# Patient Record
Sex: Female | Born: 1995 | Race: Asian | Hispanic: No | Marital: Married | Smoking: Never smoker
Health system: Southern US, Community
[De-identification: ages and names within clinical notes are randomized; demographics above are authoritative.]

## PROBLEM LIST (undated history)

## (undated) ENCOUNTER — Inpatient Hospital Stay (HOSPITAL_COMMUNITY): Payer: Self-pay

## (undated) DIAGNOSIS — Z789 Other specified health status: Secondary | ICD-10-CM

## (undated) HISTORY — PX: NO PAST SURGERIES: SHX2092

---

## 2017-09-19 ENCOUNTER — Ambulatory Visit: Payer: Self-pay | Admitting: Urgent Care

## 2017-09-19 ENCOUNTER — Encounter: Payer: Self-pay | Admitting: Urgent Care

## 2017-09-19 VITALS — BP 99/65 | HR 83 | Temp 98.3°F | Resp 16 | Ht 64.25 in | Wt 108.2 lb

## 2017-09-19 DIAGNOSIS — N926 Irregular menstruation, unspecified: Secondary | ICD-10-CM

## 2017-09-19 DIAGNOSIS — Z3491 Encounter for supervision of normal pregnancy, unspecified, first trimester: Secondary | ICD-10-CM

## 2017-09-19 LAB — POCT URINE PREGNANCY: Preg Test, Ur: POSITIVE — AB

## 2017-09-19 NOTE — Patient Instructions (Addendum)
First Trimester of Pregnancy The first trimester of pregnancy is from week 1 until the end of week 13 (months 1 through 3). A week after a sperm fertilizes an egg, the egg will implant on the wall of the uterus. This embryo will begin to develop into a baby. Genes from you and your partner will form the baby. The female genes will determine whether the baby will be a boy or a girl. At 6-8 weeks, the eyes and face will be formed, and the heartbeat can be seen on ultrasound. At the end of 12 weeks, all the baby's organs will be formed. Now that you are pregnant, you will want to do everything you can to have a healthy baby. Two of the most important things are to get good prenatal care and to follow your health care provider's instructions. Prenatal care is all the medical care you receive before the baby's birth. This care will help prevent, find, and treat any problems during the pregnancy and childbirth. Body changes during your first trimester Your body goes through many changes during pregnancy. The changes vary from woman to woman.  You may gain or lose a couple of pounds at first.  You may feel sick to your stomach (nauseous) and you may throw up (vomit). If the vomiting is uncontrollable, call your health care provider.  You may tire easily.  You may develop headaches that can be relieved by medicines. All medicines should be approved by your health care provider.  You may urinate more often. Painful urination may mean you have a bladder infection.  You may develop heartburn as a result of your pregnancy.  You may develop constipation because certain hormones are causing the muscles that push stool through your intestines to slow down.  You may develop hemorrhoids or swollen veins (varicose veins).  Your breasts may begin to grow larger and become tender. Your nipples may stick out more, and the tissue that surrounds them (areola) may become darker.  Your gums may bleed and may be  sensitive to brushing and flossing.  Dark spots or blotches (chloasma, mask of pregnancy) may develop on your face. This will likely fade after the baby is born.  Your menstrual periods will stop.  You may have a loss of appetite.  You may develop cravings for certain kinds of food.  You may have changes in your emotions from day to day, such as being excited to be pregnant or being concerned that something may go wrong with the pregnancy and baby.  You may have more vivid and strange dreams.  You may have changes in your hair. These can include thickening of your hair, rapid growth, and changes in texture. Some women also have hair loss during or after pregnancy, or hair that feels dry or thin. Your hair will most likely return to normal after your baby is born.  What to expect at prenatal visits During a routine prenatal visit:  You will be weighed to make sure you and the baby are growing normally.  Your blood pressure will be taken.  Your abdomen will be measured to track your baby's growth.  The fetal heartbeat will be listened to between weeks 10 and 14 of your pregnancy.  Test results from any previous visits will be discussed.  Your health care provider may ask you:  How you are feeling.  If you are feeling the baby move.  If you have had any abnormal symptoms, such as leaking fluid, bleeding, severe headaches,   or abdominal cramping.  If you are using any tobacco products, including cigarettes, chewing tobacco, and electronic cigarettes.  If you have any questions.  Other tests that may be performed during your first trimester include:  Blood tests to find your blood type and to check for the presence of any previous infections. The tests will also be used to check for low iron levels (anemia) and protein on red blood cells (Rh antibodies). Depending on your risk factors, or if you previously had diabetes during pregnancy, you may have tests to check for high blood  sugar that affects pregnant women (gestational diabetes).  Urine tests to check for infections, diabetes, or protein in the urine.  An ultrasound to confirm the proper growth and development of the baby.  Fetal screens for spinal cord problems (spina bifida) and Down syndrome.  HIV (human immunodeficiency virus) testing. Routine prenatal testing includes screening for HIV, unless you choose not to have this test.  You may need other tests to make sure you and the baby are doing well.  Follow these instructions at home: Medicines  Follow your health care provider's instructions regarding medicine use. Specific medicines may be either safe or unsafe to take during pregnancy.  Take a prenatal vitamin that contains at least 600 micrograms (mcg) of folic acid.  If you develop constipation, try taking a stool softener if your health care provider approves. Eating and drinking  Eat a balanced diet that includes fresh fruits and vegetables, whole grains, good sources of protein such as meat, eggs, or tofu, and low-fat dairy. Your health care provider will help you determine the amount of weight gain that is right for you.  Avoid raw meat and uncooked cheese. These carry germs that can cause birth defects in the baby.  Eating four or five small meals rather than three large meals a day may help relieve nausea and vomiting. If you start to feel nauseous, eating a few soda crackers can be helpful. Drinking liquids between meals, instead of during meals, also seems to help ease nausea and vomiting.  Limit foods that are high in fat and processed sugars, such as fried and sweet foods.  To prevent constipation: ? Eat foods that are high in fiber, such as fresh fruits and vegetables, whole grains, and beans. ? Drink enough fluid to keep your urine clear or pale yellow. Activity  Exercise only as directed by your health care provider. Most women can continue their usual exercise routine during  pregnancy. Try to exercise for 30 minutes at least 5 days a week. Exercising will help you: ? Control your weight. ? Stay in shape. ? Be prepared for labor and delivery.  Experiencing pain or cramping in the lower abdomen or lower back is a good sign that you should stop exercising. Check with your health care provider before continuing with normal exercises.  Try to avoid standing for long periods of time. Move your legs often if you must stand in one place for a long time.  Avoid heavy lifting.  Wear low-heeled shoes and practice good posture.  You may continue to have sex unless your health care provider tells you not to. Relieving pain and discomfort  Wear a good support bra to relieve breast tenderness.  Take warm sitz baths to soothe any pain or discomfort caused by hemorrhoids. Use hemorrhoid cream if your health care provider approves.  Rest with your legs elevated if you have leg cramps or low back pain.  If you develop   varicose veins in your legs, wear support hose. Elevate your feet for 15 minutes, 3-4 times a day. Limit salt in your diet. Prenatal care  Schedule your prenatal visits by the twelfth week of pregnancy. They are usually scheduled monthly at first, then more often in the last 2 months before delivery.  Write down your questions. Take them to your prenatal visits.  Keep all your prenatal visits as told by your health care provider. This is important. Safety  Wear your seat belt at all times when driving.  Make a list of emergency phone numbers, including numbers for family, friends, the hospital, and police and fire departments. General instructions  Ask your health care provider for a referral to a local prenatal education class. Begin classes no later than the beginning of month 6 of your pregnancy.  Ask for help if you have counseling or nutritional needs during pregnancy. Your health care provider can offer advice or refer you to specialists for help  with various needs.  Do not use hot tubs, steam rooms, or saunas.  Do not douche or use tampons or scented sanitary pads.  Do not cross your legs for long periods of time.  Avoid cat litter boxes and soil used by cats. These carry germs that can cause birth defects in the baby and possibly loss of the fetus by miscarriage or stillbirth.  Avoid all smoking, herbs, alcohol, and medicines not prescribed by your health care provider. Chemicals in these products affect the formation and growth of the baby.  Do not use any products that contain nicotine or tobacco, such as cigarettes and e-cigarettes. If you need help quitting, ask your health care provider. You may receive counseling support and other resources to help you quit.  Schedule a dentist appointment. At home, brush your teeth with a soft toothbrush and be gentle when you floss. Contact a health care provider if:  You have dizziness.  You have mild pelvic cramps, pelvic pressure, or nagging pain in the abdominal area.  You have persistent nausea, vomiting, or diarrhea.  You have a bad smelling vaginal discharge.  You have pain when you urinate.  You notice increased swelling in your face, hands, legs, or ankles.  You are exposed to fifth disease or chickenpox.  You are exposed to German measles (rubella) and have never had it. Get help right away if:  You have a fever.  You are leaking fluid from your vagina.  You have spotting or bleeding from your vagina.  You have severe abdominal cramping or pain.  You have rapid weight gain or loss.  You vomit blood or material that looks like coffee grounds.  You develop a severe headache.  You have shortness of breath.  You have any kind of trauma, such as from a fall or a car accident. Summary  The first trimester of pregnancy is from week 1 until the end of week 13 (months 1 through 3).  Your body goes through many changes during pregnancy. The changes vary from  woman to woman.  You will have routine prenatal visits. During those visits, your health care provider will examine you, discuss any test results you may have, and talk with you about how you are feeling. This information is not intended to replace advice given to you by your health care provider. Make sure you discuss any questions you have with your health care provider. Document Released: 03/01/2001 Document Revised: 02/17/2016 Document Reviewed: 02/17/2016 Elsevier Interactive Patient Education  2018 Elsevier   Inc.     IF you received an x-ray today, you will receive an invoice from [facility]. Please contact [facility] at [phone] with questions or concerns regarding your invoice.   IF you received labwork today, you will receive an invoice from [facility]. Please contact [facility] at [phone] with questions or concerns regarding your invoice.   Our billing staff will not be able to assist you with questions regarding bills from these companies.  You will be contacted with the lab results as soon as they are available. The fastest way to get your results is to activate your My Chart account. Instructions are located on the last page of this paperwork. If you have not heard from us regarding the results in 2 weeks, please contact this office.

## 2017-09-19 NOTE — Progress Notes (Signed)
    MRN: [id number] DOB: 1995-05-24  Subjective:   Valerie Ward is a 22 y.o. female presenting for confirmation of pregnancy so that she can obtain obstetrician care.  Patient is from [place].  She had a positive pregnancy test on 08/20/2017.  Her ultrasound showed that she was about [redacted] weeks pregnant at the time.  She is currently taking a prenatal vitamin.  She also had immunizations updated on 07/18/2017 including MMR, varicella.  Valerie Ward currently has no medications in their medication list. Also has No Known Allergies.  Valerie Ward  has no past medical history on file. Also  has no past surgical history on file.  Objective:   Vitals: BP 99/65   Pulse 83   Temp 98.3 F (36.8 C) (Oral)   Resp 16   Ht 5' 4.25" (1.632 m)   Wt 108 lb 3.2 oz (49.1 kg)   LMP 08/31/2017   SpO2 99%   BMI 18.43 kg/m   Physical Exam  Constitutional: She is oriented to person, place, and time. She appears well-developed and well-nourished.  Cardiovascular: Normal rate.  Pulmonary/Chest: Effort normal.  Neurological: She is alert and oriented to person, place, and time.   Results for orders placed or performed in visit on 09/19/17 (from the past 24 hour(s))  POCT urine pregnancy     Status: Abnormal   Collection Time: 09/19/17  2:36 PM  Result Value Ref Range   Preg Test, Ur Positive (A) Negative   Assessment and Plan :   First trimester pregnancy  Missed period - Plan: POCT urine pregnancy  Letter provided to patient as her urine pregnancy test was positive in our clinic today.  Follow-up as needed.  Jaynee Eagles, PA-C [facility] [phone] 09/19/2017  2:57 PM

## 2017-09-20 ENCOUNTER — Telehealth: Payer: Self-pay | Admitting: Urgent Care

## 2017-09-20 NOTE — Telephone Encounter (Signed)
Copied from CRM [phone]. Topic: General - Other >> Sep 20, 2017 10:18 AM Oneal GroutSebastian, Jennifer S wrote: Reason for CRM: Would like know pregnancy due date

## 2017-09-21 ENCOUNTER — Encounter (HOSPITAL_COMMUNITY): Payer: Self-pay | Admitting: *Deleted

## 2017-09-21 ENCOUNTER — Inpatient Hospital Stay (HOSPITAL_COMMUNITY)
Admission: AD | Admit: 2017-09-21 | Discharge: 2017-09-21 | Disposition: A | Discharge status: Home / Self Care | Payer: Medicaid Other | Source: Ambulatory Visit | Attending: Obstetrics and Gynecology | Admitting: Obstetrics and Gynecology

## 2017-09-21 ENCOUNTER — Inpatient Hospital Stay (HOSPITAL_COMMUNITY): Payer: Medicaid Other

## 2017-09-21 DIAGNOSIS — Z3A12 12 weeks gestation of pregnancy: Secondary | ICD-10-CM | POA: Diagnosis not present

## 2017-09-21 DIAGNOSIS — O209 Hemorrhage in early pregnancy, unspecified: Secondary | ICD-10-CM | POA: Diagnosis present

## 2017-09-21 HISTORY — DX: Other specified health status: Z78.9

## 2017-09-21 LAB — CBC WITH DIFFERENTIAL/PLATELET
Basophils Absolute: 0 10*3/uL (ref 0.0–0.1)
Basophils Relative: 0 %
EOS ABS: 0.1 10*3/uL (ref 0.0–0.7)
Eosinophils Relative: 1 %
HCT: 36.1 % (ref 36.0–46.0)
HEMOGLOBIN: 12.1 g/dL (ref 12.0–15.0)
LYMPHS PCT: 23 %
Lymphs Abs: 2.6 10*3/uL (ref 0.7–4.0)
MCH: 31.2 pg (ref 26.0–34.0)
MCHC: 33.5 g/dL (ref 30.0–36.0)
MCV: 93 fL (ref 78.0–100.0)
MONOS PCT: 3 %
Monocytes Absolute: 0.3 10*3/uL (ref 0.1–1.0)
NEUTROS ABS: 8.1 10*3/uL — AB (ref 1.7–7.7)
Neutrophils Relative %: 73 %
Other: 0 %
Platelets: 138 10*3/uL — ABNORMAL LOW (ref 150–400)
RBC: 3.88 MIL/uL (ref 3.87–5.11)
RDW: 14.1 % (ref 11.5–15.5)
WBC: 11.1 10*3/uL — AB (ref 4.0–10.5)

## 2017-09-21 LAB — HCG, QUANTITATIVE, PREGNANCY: HCG, BETA CHAIN, QUANT, S: 196919 m[IU]/mL — AB (ref <5)

## 2017-09-21 NOTE — Discharge Instructions (Signed)
Vaginal Bleeding During Pregnancy, First Trimester A small amount of bleeding (spotting) from the vagina is common in early pregnancy. Sometimes the bleeding is normal and is not a problem, and sometimes it is a sign of something serious. Be sure to tell your doctor about any bleeding from your vagina right away. Follow these instructions at home:  Watch your condition for any changes.  Follow your doctor's instructions about how active you can be.  If you are on bed rest: ? You may need to stay in bed and only get up to use the bathroom. ? You may be allowed to do some activities. ? If you need help, make plans for someone to help you.  Write down: ? The number of pads you use each day. ? How often you change pads. ? How soaked (saturated) your pads are.  Do not use tampons.  Do not douche.  Do not have sex or orgasms until your doctor says it is okay.  If you pass any tissue from your vagina, save the tissue so you can show it to your doctor.  Only take medicines as told by your doctor.  Do not take aspirin because it can make you bleed.  Keep all follow-up visits as told by your doctor. Contact a doctor if:  You bleed from your vagina.  You have cramps.  You have labor pains.  You have a fever that does not go away after you take medicine. Get help right away if:  You have very bad cramps in your back or belly (abdomen).  You pass large clots or tissue from your vagina.  You bleed more.  You feel light-headed or weak.  You pass out (faint).  You have chills.  You are leaking fluid or have a gush of fluid from your vagina.  You pass out while pooping (having a bowel movement). This information is not intended to replace advice given to you by your health care provider. Make sure you discuss any questions you have with your health care provider. Document Released: 07/22/2013 Document Revised: 08/13/2015 Document Reviewed: 11/12/2012 Elsevier Interactive  Patient Education  2018 [employer].   Pelvic Rest Pelvic rest may be recommended if:  Your placenta is partially or completely covering the opening of your cervix (placenta previa).  There is bleeding between the wall of the uterus and the amniotic sac in the first trimester of pregnancy (subchorionic hemorrhage).  You went into labor too early (preterm labor).  Based on your overall health and the health of your baby, your health care provider will decide if pelvic rest is right for you. How do I rest my pelvis? For as long as told by your health care provider:  Do not have sex, sexual stimulation, or an orgasm.  Do not use tampons. Do not douche. Do not put anything in your vagina.  Do not lift anything that is heavier than 10 lb (4.5 kg).  Avoid activities that take a lot of effort (are strenuous).  Avoid any activity in which your pelvic muscles could become strained.  When should I seek medical care? Seek medical care if you have:  Cramping pain in your lower abdomen.  Vaginal discharge.  A low, dull backache.  Regular contractions.  Uterine tightening.  When should I seek immediate medical care? Seek immediate medical care if:  You have vaginal bleeding and you are pregnant.  This information is not intended to replace advice given to you by your health care provider. Make  sure you discuss any questions you have with your health care provider. Document Released: 07/02/2010 Document Revised: 08/13/2015 Document Reviewed: 09/08/2014 Elsevier Interactive Patient Education  2018 [employer].  First Trimester of Pregnancy The first trimester of pregnancy is from week 1 until the end of week 13 (months 1 through 3). During this time, your baby will begin to develop inside you. At 6-8 weeks, the eyes and face are formed, and the heartbeat can be seen on ultrasound. At the end of 12 weeks, all the baby's organs are formed. Prenatal care is all the medical care you  receive before the birth of your baby. Make sure you get good prenatal care and follow all of your doctor's instructions. Follow these instructions at home: Medicines  Take over-the-counter and prescription medicines only as told by your doctor. Some medicines are safe and some medicines are not safe during pregnancy.  Take a prenatal vitamin that contains at least 600 micrograms (mcg) of folic acid.  If you have trouble pooping (constipation), take medicine that will make your stool soft (stool softener) if your doctor approves. Eating and drinking  Eat regular, healthy meals.  Your doctor will tell you the amount of weight gain that is right for you.  Avoid raw meat and uncooked cheese.  If you feel sick to your stomach (nauseous) or throw up (vomit): ? Eat 4 or 5 small meals a day instead of 3 large meals. ? Try eating a few soda crackers. ? Drink liquids between meals instead of during meals.  To prevent constipation: ? Eat foods that are high in fiber, like fresh fruits and vegetables, whole grains, and beans. ? Drink enough fluids to keep your pee (urine) clear or pale yellow. Activity  Exercise only as told by your doctor. Stop exercising if you have cramps or pain in your lower belly (abdomen) or low back.  Do not exercise if it is too hot, too humid, or if you are in a place of great height (high altitude).  Try to avoid standing for long periods of time. Move your legs often if you must stand in one place for a long time.  Avoid heavy lifting.  Wear low-heeled shoes. Sit and stand up straight.  You can have sex unless your doctor tells you not to. Relieving pain and discomfort  Wear a good support bra if your breasts are sore.  Take warm water baths (sitz baths) to soothe pain or discomfort caused by hemorrhoids. Use hemorrhoid cream if your doctor says it is okay.  Rest with your legs raised if you have leg cramps or low back pain.  If you have puffy, bulging  veins (varicose veins) in your legs: ? Wear support hose or compression stockings as told by your doctor. ? Raise (elevate) your feet for 15 minutes, 3-4 times a day. ? Limit salt in your food. Prenatal care  Schedule your prenatal visits by the twelfth week of pregnancy.  Write down your questions. Take them to your prenatal visits.  Keep all your prenatal visits as told by your doctor. This is important. Safety  Wear your seat belt at all times when driving.  Make a list of emergency phone numbers. The list should include numbers for family, friends, the hospital, and police and fire departments. General instructions  Ask your doctor for a referral to a local prenatal class. Begin classes no later than at the start of month 6 of your pregnancy.  Ask for help if you need  counseling or if you need help with nutrition. Your doctor can give you advice or tell you where to go for help.  Do not use hot tubs, steam rooms, or saunas.  Do not douche or use tampons or scented sanitary pads.  Do not cross your legs for long periods of time.  Avoid all herbs and alcohol. Avoid drugs that are not approved by your doctor.  Do not use any tobacco products, including cigarettes, chewing tobacco, and electronic cigarettes. If you need help quitting, ask your doctor. You may get counseling or other support to help you quit.  Avoid cat litter boxes and soil used by cats. These carry germs that can cause birth defects in the baby and can cause a loss of your baby (miscarriage) or stillbirth.  Visit your dentist. At home, brush your teeth with a soft toothbrush. Be gentle when you floss. Contact a doctor if:  You are dizzy.  You have mild cramps or pressure in your lower belly.  You have a nagging pain in your belly area.  You continue to feel sick to your stomach, you throw up, or you have watery poop (diarrhea).  You have a bad smelling fluid coming from your vagina.  You have pain when  you pee (urinate).  You have increased puffiness (swelling) in your face, hands, legs, or ankles. Get help right away if:  You have a fever.  You are leaking fluid from your vagina.  You have spotting or bleeding from your vagina.  You have very bad belly cramping or pain.  You gain or lose weight rapidly.  You throw up blood. It may look like coffee grounds.  You are around people who have [place] measles, fifth disease, or chickenpox.  You have a very bad headache.  You have shortness of breath.  You have any kind of trauma, such as from a fall or a car accident. Summary  The first trimester of pregnancy is from week 1 until the end of week 13 (months 1 through 3).  To take care of yourself and your unborn baby, you will need to eat healthy meals, take medicines only if your doctor tells you to do so, and do activities that are safe for you and your baby.  Keep all follow-up visits as told by your doctor. This is important as your doctor will have to ensure that your baby is healthy and growing well. This information is not intended to replace advice given to you by your health care provider. Make sure you discuss any questions you have with your health care provider. Document Released: 08/24/2007 Document Revised: 03/15/2016 Document Reviewed: 03/15/2016 Elsevier Interactive Patient Education  2017 [employer]

## 2017-09-21 NOTE — MAU Note (Signed)
Pt reports she is [redacted] weeks pregnant and is bleeding

## 2017-09-21 NOTE — MAU Provider Note (Signed)
History     CSN: [id number]  Arrival date and time: 09/21/17 7829   First Provider Initiated Contact with Patient 09/21/17 1918      Chief Complaint  Patient presents with  . Vaginal Bleeding   HPI  Ms. Valerie Ward is a 22 y.o. G1P0 at [redacted]w[redacted]d who presents to MAU today with complaint of vaginal bleeding x 1 hour. The patient states bleeding is heavy. She has had mild cramping since this morning. She has not taken anything for pain. She denies fever. She had +UPT at urgent care earlier this week. She states LMP 06/26/17.    OB History    Gravida  1   Para      Term      Preterm      AB      Living        SAB      TAB      Ectopic      Multiple      Live Births              Past Medical History:  Diagnosis Date  . Medical history non-contributory     Past Surgical History:  Procedure Laterality Date  . NO PAST SURGERIES      Family History  Problem Relation Age of Onset  . ADD / ADHD Neg Hx   . Alcohol abuse Neg Hx   . Arthritis Neg Hx   . Anxiety disorder Neg Hx   . Asthma Neg Hx   . Birth defects Neg Hx   . Cancer Neg Hx   . COPD Neg Hx   . Depression Neg Hx   . Diabetes Neg Hx   . Drug abuse Neg Hx   . Early death Neg Hx   . Hearing loss Neg Hx   . Heart disease Neg Hx   . Hyperlipidemia Neg Hx   . Hypertension Neg Hx   . Intellectual disability Neg Hx   . Learning disabilities Neg Hx   . Kidney disease Neg Hx   . Miscarriages / Stillbirths Neg Hx   . Obesity Neg Hx   . Stroke Neg Hx   . Vision loss Neg Hx   . Varicose Veins Neg Hx     Social History   Tobacco Use  . Smoking status: Never Smoker  . Smokeless tobacco: Never Used  Substance Use Topics  . Alcohol use: Never    Frequency: Never  . Drug use: Never    Allergies: No Known Allergies  Medications Prior to Admission  Medication Sig Dispense Refill Last Dose  . Prenatal Vit-Fe Fumarate-FA (PRENATAL MULTIVITAMIN) TABS tablet Take 1 tablet by mouth daily at 12 noon.    09/20/2017 at Unknown time    Review of Systems  Constitutional: Negative for fever.  Gastrointestinal: Positive for abdominal pain. Negative for constipation, diarrhea, nausea and vomiting.  Genitourinary: Positive for vaginal bleeding. Negative for vaginal discharge.   Physical Exam   Blood pressure 100/61, pulse 70, temperature 98.5 F (36.9 C), temperature source Oral, resp. rate 17, height 5' 3.5" (1.613 m), weight 109 lb (49.4 kg), last menstrual period 06/26/2017, SpO2 100 %.  Physical Exam  Nursing note and vitals reviewed. Constitutional: She is oriented to person, place, and time. She appears well-developed and well-nourished. No distress.  HENT:  Head: Normocephalic and atraumatic.  Cardiovascular: Normal rate.  Respiratory: Effort normal.  GI: Soft. She exhibits no distension and no mass. There is no tenderness. There is  no rebound and no guarding.  Genitourinary: Uterus is enlarged. Uterus is not tender. Cervix exhibits no motion tenderness, no discharge and no friability. There is bleeding (small) in the vagina. No vaginal discharge found.  Genitourinary Comments: Cervix: closed, thick, firm  Neurological: She is alert and oriented to person, place, and time.  Skin: Skin is warm and dry. No erythema.  Psychiatric: She has a normal mood and affect.     Results for orders placed or performed during the hospital encounter of 09/21/17 (from the past 24 hour(s))  CBC with Differential/Platelet     Status: Abnormal   Collection Time: 09/21/17  7:21 PM  Result Value Ref Range   WBC 11.1 (H) 4.0 - 10.5 K/uL   RBC 3.88 3.87 - 5.11 MIL/uL   Hemoglobin 12.1 12.0 - 15.0 g/dL   HCT 82.9 56.2 - 13.0 %   MCV 93.0 78.0 - 100.0 fL   MCH 31.2 26.0 - 34.0 pg   MCHC 33.5 30.0 - 36.0 g/dL   RDW 86.5 78.4 - 69.6 %   Platelets 138 (L) 150 - 400 K/uL   Neutrophils Relative % 73 %   Lymphocytes Relative 23 %   Monocytes Relative 3 %   Eosinophils Relative 1 %   Basophils Relative 0 %    Other 0 %   Neutro Abs 8.1 (H) 1.7 - 7.7 K/uL   Lymphs Abs 2.6 0.7 - 4.0 K/uL   Monocytes Absolute 0.3 0.1 - 1.0 K/uL   Eosinophils Absolute 0.1 0.0 - 0.7 K/uL   Basophils Absolute 0.0 0.0 - 0.1 K/uL   WBC Morphology ATYPICAL LYMPHOCYTES   ABO/Rh     Status: None (Preliminary result)   Collection Time: 09/21/17  7:21 PM  Result Value Ref Range   ABO/RH(D)      O POS Performed at [facility], [address] [place], [place] [address]    US Ob Comp Less 14 Wks  Result Date: 09/21/2017 CLINICAL DATA:  22 year old pregnant female with vaginal bleeding today. Estimated gestational age of [redacted] weeks 3 days by LMP. EXAM: OBSTETRIC <14 WK ULTRASOUND TECHNIQUE: Transabdominal ultrasound was performed for evaluation of the gestation as well as the maternal uterus and adnexal regions. COMPARISON:  None. FINDINGS: Intrauterine gestational sac: Single Yolk sac:  Not Visualized. Embryo:  Visualized. Cardiac Activity: Visualized. Heart Rate: 159 bpm CRL:   58 mm   12 w 2 d                  [name] EDC: 04/03/2010 Subchorionic hemorrhage:  None visualized. Maternal uterus/adnexae: The ovaries bilaterally are unremarkable. No free fluid or adnexal mass. IMPRESSION: Single living intrauterine gestation with estimated gestational age of [redacted] weeks 3 days by LMP. Estimated gestational age by this ultrasound is 12 weeks 2 days. No subchorionic hemorrhage or acute abnormality. Electronically Signed   By: Harmon Pier M.D.   On: 09/21/2017 19:52     MAU Course  Procedures None  MDM +UPT at Urgent Care this week in Epic  UA, CBC, ABO/Rh, quant hCG and [name] today to rule out ectopic pregnancy  Assessment and Plan  A: SIUP at [redacted]w[redacted]d Vaginal bleeding in pregnancy, first trimester   P: Discharge home Tylenol PRN for pain  Bleeding precautions discussed Patient advised to follow-up with [facility] as planned to start prenatal care at the end of the month Patient may return to MAU as needed or if her condition were  to change or worsen   Vonzella Nipple,  PA-C 09/21/2017, 8:23 PM

## 2017-09-21 NOTE — MAU Note (Signed)
Urine in lab 

## 2017-09-22 LAB — ABO/RH: ABO/RH(D): O POS

## 2017-09-23 NOTE — Telephone Encounter (Signed)
LMOVM for pt to count from date of U/S forward 33 weeks.  She was 7 weeks at U/S and total pregnancy is 40 weeks. Asked pt to call the office with any further questions.

## 2017-10-23 LAB — OB RESULTS CONSOLE HEPATITIS B SURFACE ANTIGEN: Hepatitis B Surface Ag: NEGATIVE

## 2017-10-23 LAB — OB RESULTS CONSOLE GC/CHLAMYDIA
Chlamydia: NEGATIVE
Gonorrhea: NEGATIVE

## 2017-10-23 LAB — OB RESULTS CONSOLE HIV ANTIBODY (ROUTINE TESTING): HIV: NONREACTIVE

## 2017-10-23 LAB — OB RESULTS CONSOLE RUBELLA ANTIBODY, IGM: Rubella: IMMUNE

## 2018-03-06 LAB — OB RESULTS CONSOLE GBS: GBS: NEGATIVE

## 2018-03-21 NOTE — L&D Delivery Note (Signed)
Delivery Note At 1:13 AM a viable female was delivered via Vaginal, Spontaneous (Presentation OP). Loose nuchal cord reduced. APGAR: 8, 9; weight 8 lb 3.6 oz (3731 g).   Placenta status: Spontaneous and intact, .  Cord: 3VC with the following complications: NONE  Anesthesia:  Epidural Episiotomy: None Lacerations: 1st degree Suture Repair: 2.0 3.0 vicryl Est. Blood Loss (mL): 925  Mom to postpartum.  Baby to Couplet care / Skin to Skin.  Valerie Ward 04/02/2018, 3:05 AM  Late entry

## 2018-03-27 ENCOUNTER — Inpatient Hospital Stay (HOSPITAL_COMMUNITY)
Admission: AD | Admit: 2018-03-27 | Discharge: 2018-03-27 | Disposition: A | Discharge status: Home / Self Care | Payer: Medicaid Other | Attending: Obstetrics and Gynecology | Admitting: Obstetrics and Gynecology

## 2018-03-27 ENCOUNTER — Other Ambulatory Visit: Payer: Self-pay

## 2018-03-27 DIAGNOSIS — O471 False labor at or after 37 completed weeks of gestation: Secondary | ICD-10-CM | POA: Diagnosis present

## 2018-03-27 DIAGNOSIS — Z3689 Encounter for other specified antenatal screening: Secondary | ICD-10-CM

## 2018-03-27 DIAGNOSIS — Z3A39 39 weeks gestation of pregnancy: Secondary | ICD-10-CM | POA: Diagnosis not present

## 2018-03-27 DIAGNOSIS — O479 False labor, unspecified: Secondary | ICD-10-CM

## 2018-03-27 LAB — URINALYSIS, ROUTINE W REFLEX MICROSCOPIC
Bilirubin Urine: NEGATIVE
Glucose, UA: NEGATIVE mg/dL
Hgb urine dipstick: NEGATIVE
Ketones, ur: NEGATIVE mg/dL
Nitrite: NEGATIVE
Protein, ur: NEGATIVE mg/dL
Specific Gravity, Urine: 1.003 — ABNORMAL LOW (ref 1.005–1.030)
pH: 6 (ref 5.0–8.0)

## 2018-03-27 NOTE — MAU Provider Note (Signed)
None    S: Ms. Letoria Terranova is a 23 y.o. G1P0 at [redacted]w[redacted]d  who presents to MAU today complaining contractions q 2-10 minutes since yesterday afternoon. She denies vaginal bleeding. She denies LOF. She reports normal fetal movement but states she had difficulty identifying fetal movement last night.    O: BP 114/67   Pulse 93   Temp (!) 97.5 F (36.4 C)   Resp 16   Ht 5\' 4"  (1.626 m)   Wt 78 kg   LMP 06/26/2017   SpO2 100%   BMI 29.52 kg/m  GENERAL: Well-developed, well-nourished female in no acute distress.  HEAD: Normocephalic, atraumatic.  CHEST: Normal effort of breathing, regular heart rate ABDOMEN: Soft, nontender, gravid  Cervical exam:  Dilation: 1.5 Effacement (%): Thick Cervical Position: Posterior Station: -3 Presentation: Vertex Exam by:: Ginnie Smart RN   Fetal Monitoring: Baseline: 145 Variability: moderate Accelerations: positive Decelerations: none Contractions: irregular, palpate mild, not consistently felt by patient   A: SIUP at [redacted]w[redacted]d  False labor Reactive fetal tracing Discomfort exacerbated by fetal positioning  P: Discharge home in stable condition Keep f/u appt scheduled for 04/02/2018  Calvert Cantor, CNM 03/27/2018 12:50 PM

## 2018-03-27 NOTE — MAU Note (Signed)
Pt presents to MAU with complaints of lower abdominal cramping since last night. Decrease in fetal movement. Denies any VB or LOF

## 2018-03-27 NOTE — Discharge Instructions (Signed)
Braxton Hicks Contractions Contractions of the uterus can occur throughout pregnancy, but they are not always a sign that you are in labor. You may have practice contractions called Braxton Hicks contractions. These false labor contractions are sometimes confused with true labor. What are Braxton Hicks contractions? Braxton Hicks contractions are tightening movements that occur in the muscles of the uterus before labor. Unlike true labor contractions, these contractions do not result in opening (dilation) and thinning of the cervix. Toward the end of pregnancy (32-34 weeks), Braxton Hicks contractions can happen more often and may become stronger. These contractions are sometimes difficult to tell apart from true labor because they can be very uncomfortable. You should not feel embarrassed if you go to the hospital with false labor. Sometimes, the only way to tell if you are in true labor is for your health care provider to look for changes in the cervix. The health care provider will do a physical exam and may monitor your contractions. If you are not in true labor, the exam should show that your cervix is not dilating and your water has not broken. If there are no other health problems associated with your pregnancy, it is completely safe for you to be sent home with false labor. You may continue to have Braxton Hicks contractions until you go into true labor. How to tell the difference between true labor and false labor True labor  Contractions last 30-70 seconds.  Contractions become very regular.  Discomfort is usually felt in the top of the uterus, and it spreads to the lower abdomen and low back.  Contractions do not go away with walking.  Contractions usually become more intense and increase in frequency.  The cervix dilates and gets thinner. False labor  Contractions are usually shorter and not as strong as true labor contractions.  Contractions are usually irregular.  Contractions  are often felt in the front of the lower abdomen and in the groin.  Contractions may go away when you walk around or change positions while lying down.  Contractions get weaker and are shorter-lasting as time goes on.  The cervix usually does not dilate or become thin. Follow these instructions at home:   Take over-the-counter and prescription medicines only as told by your health care provider.  Keep up with your usual exercises and follow other instructions from your health care provider.  Eat and drink lightly if you think you are going into labor.  If Braxton Hicks contractions are making you uncomfortable: ? Change your position from lying down or resting to walking, or change from walking to resting. ? Sit and rest in a tub of warm water. ? Drink enough fluid to keep your urine pale yellow. Dehydration may cause these contractions. ? Do slow and deep breathing several times an hour.  Keep all follow-up prenatal visits as told by your health care provider. This is important. Contact a health care provider if:  You have a fever.  You have continuous pain in your abdomen. Get help right away if:  Your contractions become stronger, more regular, and closer together.  You have fluid leaking or gushing from your vagina.  You pass blood-tinged mucus (bloody show).  You have bleeding from your vagina.  You have low back pain that you never had before.  You feel your baby's head pushing down and causing pelvic pressure.  Your baby is not moving inside you as much as it used to. Summary  Contractions that occur before labor are   called Braxton Hicks contractions, false labor, or practice contractions.  Braxton Hicks contractions are usually shorter, weaker, farther apart, and less regular than true labor contractions. True labor contractions usually become progressively stronger and regular, and they become more frequent.  Manage discomfort from Braxton Hicks contractions  by changing position, resting in a warm bath, drinking plenty of water, or practicing deep breathing. This information is not intended to replace advice given to you by your health care provider. Make sure you discuss any questions you have with your health care provider. Document Released: 07/21/2016 Document Revised: 12/20/2016 Document Reviewed: 07/21/2016 Elsevier Interactive Patient Education  2019 [employer]  

## 2018-03-29 ENCOUNTER — Inpatient Hospital Stay (HOSPITAL_COMMUNITY)
Admission: AD | Admit: 2018-03-29 | Discharge: 2018-03-29 | Disposition: A | Discharge status: Home / Self Care | Payer: Medicaid Other | Source: Intra-hospital | Attending: Obstetrics & Gynecology | Admitting: Obstetrics & Gynecology

## 2018-03-29 ENCOUNTER — Encounter (HOSPITAL_COMMUNITY): Payer: Self-pay | Admitting: *Deleted

## 2018-03-29 DIAGNOSIS — O4693 Antepartum hemorrhage, unspecified, third trimester: Secondary | ICD-10-CM | POA: Diagnosis present

## 2018-03-29 DIAGNOSIS — O26893 Other specified pregnancy related conditions, third trimester: Secondary | ICD-10-CM | POA: Diagnosis not present

## 2018-03-29 DIAGNOSIS — Z0371 Encounter for suspected problem with amniotic cavity and membrane ruled out: Secondary | ICD-10-CM

## 2018-03-29 DIAGNOSIS — Z3A39 39 weeks gestation of pregnancy: Secondary | ICD-10-CM | POA: Diagnosis not present

## 2018-03-29 DIAGNOSIS — N898 Other specified noninflammatory disorders of vagina: Secondary | ICD-10-CM

## 2018-03-29 LAB — WET PREP, GENITAL
CLUE CELLS WET PREP: NONE SEEN
SPERM: NONE SEEN
TRICH WET PREP: NONE SEEN
YEAST WET PREP: NONE SEEN

## 2018-03-29 NOTE — MAU Note (Signed)
Pt reports vaginal bleeding off/on since last night, some contractions.

## 2018-03-29 NOTE — Discharge Instructions (Signed)
First Stage of Labor °Labor is your body's natural process of moving your baby and other structures, including the placenta and umbilical cord, out of your uterus. There are three stages of labor. How long each stage lasts is different for every woman. But certain events happen during each stage that are the same for everyone. °· The first stage starts when true labor begins. This stage ends when your cervix, which is the opening from your uterus into your vagina, is completely open (dilated). °· The second stage begins when your cervix is fully dilated and you start pushing. This stage ends when your baby is born. °· The third stage is the delivery of the organ that nourished your baby during pregnancy (placenta). °First stage of labor °As your due date gets closer, you may start to notice certain physical changes that mean labor is going to start soon. You may feel that your baby has dropped lower into your pelvis. You may experience irregular, often painless, contractions that go away when you walk around or lie down (Braxton Hicks contractions). This is also called false labor. °The first stage of labor begins when you start having contractions that come at regular (evenly spaced) intervals and your cervix starts to get thinner and wider in preparation for your baby to pass through. Birth care providers measure the dilation of your cervix in centimeters (cm). One centimeter is a little less than one-half of an inch. The first stage ends when your cervix is dilated to 10 cm. The first stage of labor is divided into three phases: °· Early phase. °· Active phase. °· Transitional phase. °The length of the first stage of labor varies. It may be longer if this is your first pregnancy. You may spend most of this stage at home trying to relax and stay comfortable. °How does this affect me? °During the first stage of labor, you will move through three phases. °What happens in the early phase? °· You will start to have  regular contractions that last 30-60 seconds. Contractions may come every 5-20 minutes. Keep track of your contractions and call your birth care provider. °· Your water may break during this phase. °· You may notice a clear or slightly bloody discharge of mucus (mucus plug) from your vagina. °· Your cervix will dilate to 3-6 cm. °What happens in the active phase? °The active phase usually lasts 3-5 hours. You may go to the hospital or birth center around this time. During the active phase: °· Your contractions will become stronger, longer, and more uncomfortable. °· Your contractions may last 45-90 seconds and come every 3-5 minutes. °· You may feel lower back pain. °· Your birth care providers may examine your cervix and feel your belly to find the position of your baby. °· You may have a monitor strapped to your belly to measure your contractions and your baby's heart rate. °· You may start using your pain management options. °· Your cervix may be dilated to 6 cm and may start to dilate more quickly. °What happens in the transitional phase? °The transitional phase typically lasts from 30 minutes to 2 hours. At the end of this phase, your cervix will be fully dilated to 10 cm. During the transitional phase: °· Contractions will get stronger and longer. °· Contractions may last 60-90 seconds and come less than 2 minutes apart. °· You may feel hot flashes, chills, or nausea. °How does this affect my baby? °During the first stage of labor, your baby will   gradually move down into your birth canal. °Follow these instructions at home and in the hospital or birth center: ° °· When labor first begins, try to stay calm. You are still in the early phase. If it is night, try to get some sleep. If it is day, try to relax and save your energy. You may want to make some calls and get ready to go to the hospital or birth center. °· When you are in the early phase, try these methods to help ease discomfort: °? Deep breathing and  muscle relaxation. °? Taking a walk. °? Taking a warm bath or shower. °· Drink some fluids and have a light snack if you feel like it. °· Keep track of your contractions. °· Based on the plan you created with your birth care provider, call when your contractions indicate it is time. °· If your water breaks, note the time, color, and odor of the fluid. °· When you are in the active phase, do your breathing exercises and rely on your support people and your team of birth care providers. °Contact a health care provider if: °· Your contractions are strong and regular. °· You have lower back pain or cramping. °· Your water breaks. °· You lose your mucus plug. °Get help right away if you: °· Have a severe headache that does not go away. °· Have changes in your vision. °· Have severe pain in your upper belly. °· Do not feel the baby move. °· Have bright red bleeding. °Summary °· The first stage of labor starts when true labor begins, and it ends when your cervix is dilated to 10 cm. °· The first stage of labor has three phases: early, active, and transitional. °· Your baby moves into the birth canal during the first stage of labor. °· You may have contractions that become stronger and longer. You may also lose your mucus plug and have your water break. °· Call your birth care provider when your contractions are frequent and strong enough to go to the hospital or birth center. °This information is not intended to replace advice given to you by your health care provider. Make sure you discuss any questions you have with your health care provider. °Document Released: 05/21/2017 Document Revised: 11/25/2017 Document Reviewed: 05/21/2017 °Elsevier Interactive Patient Education © 2019 Elsevier Inc. ° °

## 2018-03-29 NOTE — MAU Note (Signed)
Pt c/o bleeding when she wipes some mild contractions and decreased fetal movement since last night.

## 2018-03-29 NOTE — MAU Provider Note (Addendum)
History     CSN: [id number]  Arrival date and time: 03/29/18 1025   First Provider Initiated Contact with Patient 03/29/18 1132      Chief Complaint  Patient presents with  . Vaginal Bleeding  . Contractions   Valerie Ward is a 23y.o. G1 at 39.3wks who presents for vaginal bleeding.  She reports that she had bleeding with wiping, last night and this morning, but did not have any upon arrival to the MAU.  She states that the bleeding was dark brown in nature and a small (lemon sized) amount.  She also states she had some watery fluid that smelled bad (menstrual blood), but was not leaking on the way in.  Patient endorses good fetal movement, contractions, and intermittent lower abdominal cramping.       OB History    Gravida  1   Para      Term      Preterm      AB      Living        SAB      TAB      Ectopic      Multiple      Live Births              Past Medical History:  Diagnosis Date  . Medical history non-contributory     Past Surgical History:  Procedure Laterality Date  . NO PAST SURGERIES      Family History  Problem Relation Age of Onset  . ADD / ADHD Neg Hx   . Alcohol abuse Neg Hx   . Arthritis Neg Hx   . Anxiety disorder Neg Hx   . Asthma Neg Hx   . Birth defects Neg Hx   . Cancer Neg Hx   . COPD Neg Hx   . Depression Neg Hx   . Diabetes Neg Hx   . Drug abuse Neg Hx   . Early death Neg Hx   . Hearing loss Neg Hx   . Heart disease Neg Hx   . Hyperlipidemia Neg Hx   . Hypertension Neg Hx   . Intellectual disability Neg Hx   . Learning disabilities Neg Hx   . Kidney disease Neg Hx   . Miscarriages / Stillbirths Neg Hx   . Obesity Neg Hx   . Stroke Neg Hx   . Vision loss Neg Hx   . Varicose Veins Neg Hx     Social History   Tobacco Use  . Smoking status: Never Smoker  . Smokeless tobacco: Never Used  Substance Use Topics  . Alcohol use: Never    Frequency: Never  . Drug use: Never    Allergies: No Known  Allergies  Medications Prior to Admission  Medication Sig Dispense Refill Last Dose  . Prenatal Vit-Fe Fumarate-FA (PRENATAL MULTIVITAMIN) TABS tablet Take 1 tablet by mouth daily at 12 noon.   09/20/2017 at Unknown time    Review of Systems  Constitutional: Negative for chills and fever.  Gastrointestinal: Positive for abdominal pain (Lower Abdominal Cramping). Negative for diarrhea, nausea and vomiting.  Genitourinary: Positive for vaginal bleeding and vaginal discharge. Negative for dysuria.  Neurological: Negative for numbness and headaches.   Physical Exam   Blood pressure 110/67, pulse (!) 108, temperature 98.1 F (36.7 C), temperature source Oral, resp. rate 17, height 5\' 4"  (1.626 m), weight 76.7 kg, last menstrual period 06/26/2017, SpO2 100 %.  Physical Exam  Constitutional: She is oriented to person, place, and  time. She appears well-developed and well-nourished.  HENT:  Head: Normocephalic and atraumatic.  Eyes: Conjunctivae are normal.  Neck: Normal range of motion.  Cardiovascular: Normal rate, regular rhythm and normal heart sounds.  Respiratory: Effort normal.  GI: Soft.  Genitourinary: Uterus is enlarged (Gravid). Cervix exhibits discharge. Cervix exhibits no motion tenderness and no friability.    Vaginal discharge present.     No vaginal bleeding.  No bleeding in the vagina.    Genitourinary Comments: Sterile Speculum Exam: -Vaginal Vault: Pink mucosa.  Scant amt thick mucoid discharge-wet prep collected -Cervix: Large, Pink, No lesions, cysts, or polyps apparent.  Thick mucoid brownish-red discharge from os: Significant for mucous -GC/CT collected -Bimanual Exam: 1.5/50/-3    Musculoskeletal: Normal range of motion.  Neurological: She is alert and oriented to person, place, and time.  Skin: Skin is warm and dry.  Psychiatric: She has a normal mood and affect. Her behavior is normal.    MAU Course  Procedures Results for orders placed or performed during  the hospital encounter of 03/29/18 (from the past 24 hour(s))  Wet prep, genital     Status: Abnormal   Collection Time: 03/29/18 11:57 AM  Result Value Ref Range   Yeast Wet Prep HPF POC NONE SEEN NONE SEEN   Trich, Wet Prep NONE SEEN NONE SEEN   Clue Cells Wet Prep HPF POC NONE SEEN NONE SEEN   WBC, Wet Prep HPF POC MANY (A) NONE SEEN   Sperm NONE SEEN     135 bpm, Mod Var, -Decels, +Accels Ctx Q2-16min  MDM Pelvic Exam: Wet prep EFM  Assessment and Plan  IUP at 39.3wks Cat I FT Vaginal Spotting/Discharge  -Pelvic exam findings discussed. -Reassurances given -Informed of normalcy of some vaginal spotting and bleeding with first stage of labor. -Discussed findings also consistent with shedding of mucous plug -Will await results  Follow Up (12:27 PM) -Wet prep returns negative -Informed of negative results and need to follow up in office as appropriate -NST Reactive -Encouraged to call or return to MAU if symptoms worsen or with the onset of new symptoms. -Discharged to home in stable condition  Cherre Robins MSN, CNM 03/29/2018, 11:32 AM

## 2018-04-01 ENCOUNTER — Encounter (HOSPITAL_COMMUNITY): Payer: Self-pay | Admitting: *Deleted

## 2018-04-01 ENCOUNTER — Inpatient Hospital Stay (HOSPITAL_COMMUNITY)
Admission: AD | Admit: 2018-04-01 | Discharge: 2018-04-04 | DRG: 806 | Disposition: A | Discharge status: Home / Self Care | Payer: Medicaid Other | Attending: Obstetrics and Gynecology | Admitting: Obstetrics and Gynecology

## 2018-04-01 ENCOUNTER — Other Ambulatory Visit: Payer: Self-pay

## 2018-04-01 ENCOUNTER — Encounter (HOSPITAL_COMMUNITY): Payer: Self-pay

## 2018-04-01 ENCOUNTER — Inpatient Hospital Stay (HOSPITAL_COMMUNITY): Payer: Medicaid Other | Admitting: Anesthesiology

## 2018-04-01 ENCOUNTER — Inpatient Hospital Stay (EMERGENCY_DEPARTMENT_HOSPITAL)
Admission: AD | Admit: 2018-04-01 | Discharge: 2018-04-01 | Disposition: A | Discharge status: Home / Self Care | Payer: Medicaid Other | Source: Ambulatory Visit | Attending: Obstetrics and Gynecology | Admitting: Obstetrics and Gynecology

## 2018-04-01 DIAGNOSIS — Z3689 Encounter for other specified antenatal screening: Secondary | ICD-10-CM

## 2018-04-01 DIAGNOSIS — O0930 Supervision of pregnancy with insufficient antenatal care, unspecified trimester: Secondary | ICD-10-CM

## 2018-04-01 DIAGNOSIS — D696 Thrombocytopenia, unspecified: Secondary | ICD-10-CM | POA: Diagnosis present

## 2018-04-01 DIAGNOSIS — O9912 Other diseases of the blood and blood-forming organs and certain disorders involving the immune mechanism complicating childbirth: Secondary | ICD-10-CM | POA: Diagnosis present

## 2018-04-01 DIAGNOSIS — Z3403 Encounter for supervision of normal first pregnancy, third trimester: Secondary | ICD-10-CM

## 2018-04-01 DIAGNOSIS — O471 False labor at or after 37 completed weeks of gestation: Secondary | ICD-10-CM | POA: Insufficient documentation

## 2018-04-01 DIAGNOSIS — Z3A39 39 weeks gestation of pregnancy: Secondary | ICD-10-CM

## 2018-04-01 DIAGNOSIS — Z3483 Encounter for supervision of other normal pregnancy, third trimester: Secondary | ICD-10-CM | POA: Diagnosis present

## 2018-04-01 DIAGNOSIS — Z3400 Encounter for supervision of normal first pregnancy, unspecified trimester: Secondary | ICD-10-CM

## 2018-04-01 DIAGNOSIS — O479 False labor, unspecified: Secondary | ICD-10-CM

## 2018-04-01 LAB — TYPE AND SCREEN
ABO/RH(D): O POS
Antibody Screen: NEGATIVE

## 2018-04-01 LAB — CBC
HCT: 40.8 % (ref 36.0–46.0)
Hemoglobin: 13.3 g/dL (ref 12.0–15.0)
MCH: 30.2 pg (ref 26.0–34.0)
MCHC: 32.6 g/dL (ref 30.0–36.0)
MCV: 92.5 fL (ref 80.0–100.0)
Platelets: 199 10*3/uL (ref 150–400)
RBC: 4.41 MIL/uL (ref 3.87–5.11)
RDW: 14.8 % (ref 11.5–15.5)
WBC: 15.6 10*3/uL — AB (ref 4.0–10.5)
nRBC: 0.2 % (ref 0.0–0.2)

## 2018-04-01 MED ORDER — LACTATED RINGERS IV SOLN
INTRAVENOUS | Status: DC
  Administered 2018-04-01 (×3): via INTRAVENOUS

## 2018-04-01 MED ORDER — FENTANYL CITRATE (PF) 100 MCG/2ML IJ SOLN
INTRAMUSCULAR | Status: AC
  Administered 2018-04-01: 100 ug via INTRAVENOUS
  Filled 2018-04-01: qty 2

## 2018-04-01 MED ORDER — PHENYLEPHRINE 40 MCG/ML (10ML) SYRINGE FOR IV PUSH (FOR BLOOD PRESSURE SUPPORT)
80.0000 ug | PREFILLED_SYRINGE | INTRAVENOUS | Status: DC | PRN
  Filled 2018-04-01: qty 10

## 2018-04-01 MED ORDER — SOD CITRATE-CITRIC ACID 500-334 MG/5ML PO SOLN: 30.0000 mL | ORAL | Status: DC | PRN

## 2018-04-01 MED ORDER — LIDOCAINE HCL (PF) 1 % IJ SOLN
30.0000 mL | INTRAMUSCULAR | Status: DC | PRN
  Filled 2018-04-01: qty 30

## 2018-04-01 MED ORDER — LIDOCAINE HCL (PF) 1 % IJ SOLN
INTRAMUSCULAR | Status: DC | PRN
  Administered 2018-04-01 (×2): 4 mL via EPIDURAL

## 2018-04-01 MED ORDER — ONDANSETRON HCL 4 MG/2ML IJ SOLN: 4.0000 mg | Freq: Four times a day (QID) | INTRAMUSCULAR | Status: DC | PRN

## 2018-04-01 MED ORDER — OXYCODONE-ACETAMINOPHEN 5-325 MG PO TABS
1.0000 | ORAL_TABLET | ORAL | Status: DC | PRN
  Administered 2018-04-02: 1 via ORAL
  Filled 2018-04-01: qty 1

## 2018-04-01 MED ORDER — FENTANYL CITRATE (PF) 100 MCG/2ML IJ SOLN
100.0000 ug | Freq: Once | INTRAMUSCULAR | Status: AC
  Administered 2018-04-01: 100 ug via INTRAVENOUS

## 2018-04-01 MED ORDER — PHENYLEPHRINE 40 MCG/ML (10ML) SYRINGE FOR IV PUSH (FOR BLOOD PRESSURE SUPPORT)
80.0000 ug | PREFILLED_SYRINGE | INTRAVENOUS | Status: DC | PRN
  Filled 2018-04-01 (×2): qty 10

## 2018-04-01 MED ORDER — DIPHENHYDRAMINE HCL 50 MG/ML IJ SOLN: 12.5000 mg | INTRAMUSCULAR | Status: DC | PRN

## 2018-04-01 MED ORDER — EPHEDRINE 5 MG/ML INJ
10.0000 mg | INTRAVENOUS | Status: DC | PRN
  Filled 2018-04-01: qty 2

## 2018-04-01 MED ORDER — OXYTOCIN 40 UNITS IN NORMAL SALINE INFUSION - SIMPLE MED
2.5000 [IU]/h | INTRAVENOUS | Status: DC
  Filled 2018-04-01: qty 1000

## 2018-04-01 MED ORDER — OXYCODONE-ACETAMINOPHEN 5-325 MG PO TABS: 2.0000 | ORAL_TABLET | ORAL | Status: DC | PRN

## 2018-04-01 MED ORDER — ACETAMINOPHEN 325 MG PO TABS: 650.0000 mg | ORAL_TABLET | ORAL | Status: DC | PRN

## 2018-04-01 MED ORDER — BUTORPHANOL TARTRATE 1 MG/ML IJ SOLN
1.0000 mg | Freq: Once | INTRAMUSCULAR | Status: AC
  Administered 2018-04-01: 1 mg via INTRAMUSCULAR
  Filled 2018-04-01: qty 1

## 2018-04-01 MED ORDER — FENTANYL 2.5 MCG/ML BUPIVACAINE 1/10 % EPIDURAL INFUSION (WH - ANES)
14.0000 mL/h | INTRAMUSCULAR | Status: DC | PRN
  Administered 2018-04-01 – 2018-04-02 (×3): 14 mL/h via EPIDURAL
  Filled 2018-04-01 (×3): qty 100

## 2018-04-01 MED ORDER — PROMETHAZINE HCL 25 MG/ML IJ SOLN
25.0000 mg | Freq: Once | INTRAMUSCULAR | Status: AC
  Administered 2018-04-01: 25 mg via INTRAMUSCULAR
  Filled 2018-04-01: qty 1

## 2018-04-01 MED ORDER — LACTATED RINGERS IV SOLN
500.0000 mL | INTRAVENOUS | Status: DC | PRN
  Administered 2018-04-02: 500 mL via INTRAVENOUS

## 2018-04-01 MED ORDER — OXYTOCIN BOLUS FROM INFUSION
500.0000 mL | Freq: Once | INTRAVENOUS | Status: AC
  Administered 2018-04-02: 500 mL via INTRAVENOUS

## 2018-04-01 MED ORDER — LACTATED RINGERS IV SOLN
500.0000 mL | Freq: Once | INTRAVENOUS | Status: AC
  Administered 2018-04-01: 500 mL via INTRAVENOUS

## 2018-04-01 MED ORDER — PRENATAL MULTIVITAMIN CH: 1.0000 | ORAL_TABLET | Freq: Every day | ORAL | Status: DC

## 2018-04-01 MED ORDER — ACETAMINOPHEN 500 MG PO TABS
1000.0000 mg | ORAL_TABLET | Freq: Once | ORAL | Status: AC
  Administered 2018-04-01: 1000 mg via ORAL
  Filled 2018-04-01: qty 2

## 2018-04-01 NOTE — Progress Notes (Signed)
S Weinhold CNM notified of pt's admission and status. Aware of sve, ctx pattern.

## 2018-04-01 NOTE — MAU Note (Signed)
Discussed with pt option of walking an hour and being rechecked or taking pain medicine and going home to rest. Pt talked with sig other and decided to take medication and go home to try and sleep

## 2018-04-01 NOTE — Anesthesia Procedure Notes (Signed)
Epidural Patient location during procedure: OB Start time: 04/01/2018 12:54 PM End time: 04/01/2018 12:59 PM  Staffing Anesthesiologist: Kaylyn Layer, MD Performed: anesthesiologist   Preanesthetic Checklist Completed: patient identified, pre-op evaluation, timeout performed, IV checked, risks and benefits discussed and monitors and equipment checked  Epidural Patient position: sitting Prep: site prepped and draped and DuraPrep Patient monitoring: continuous pulse ox, blood pressure, heart rate and cardiac monitor Approach: midline Location: L3-L4 Injection technique: LOR air  Needle:  Needle type: Tuohy  Needle gauge: 17 G Needle length: 9 cm Needle insertion depth: 5 cm Catheter type: closed end flexible Catheter size: 19 Gauge Catheter at skin depth: 10 cm Test dose: negative and Other (1% lidocaine)  Assessment Events: blood not aspirated, injection not painful, no injection resistance, negative IV test and no paresthesia  Additional Notes Patient identified. Risks, benefits, and alternatives discussed with patient including but not limited to bleeding, infection, nerve damage, paralysis, failed block, incomplete pain control, headache, blood pressure changes, nausea, vomiting, reactions to medication, itching, and postpartum back pain. Confirmed with bedside nurse the patient's most recent platelet count. Confirmed with patient that they are not currently taking any anticoagulation, have any bleeding history, or any family history of bleeding disorders. Patient expressed understanding and wished to proceed. All questions were answered. Sterile technique was used throughout the entire procedure. Pt unable to stay still during placement due to pain with contractions, 3 attempts required to access epidural space. Crisp LOR to air. Please see nursing notes for vital signs. Test dose was given through epidural catheter and negative prior to continuing to dose epidural or start  infusion. Warning signs of high block given to the patient including shortness of breath, tingling/numbness in hands, complete motor block, or any concerning symptoms with instructions to call for help. Patient was given instructions on fall risk and not to get out of bed. All questions and concerns addressed with instructions to call with any issues or inadequate analgesia.  Reason for block:procedure for pain

## 2018-04-01 NOTE — Progress Notes (Signed)
Subjective:  Comfortable with epidural.  Slight pressure sensation.  Objective: BP 104/61   Pulse (!) 113   Temp 98.1 F (36.7 C) (Oral)   Resp 16   Ht 5\' 4"  (1.626 m)   Wt 80 kg   LMP 06/26/2017   SpO2 100%   BMI 30.27 kg/m  No intake/output data recorded. Total I/O In: -  Out: 1200 [Urine:1200]  FHT: Category I UC:   irregular, every 3 minutes SVE:   Dilation: Lip/rim Effacement (%): 100 Station: 0 Exam by:: E.Kayslee Furey,CNM Pitocin at N/A MVUs N/A  Assessment:   Active labor at term Reassuring FHR   Plan:  Labor down until feels pressure then push or in 3 hours. Anticipate NSVD   Altamese Cabal CNM, MSN 04/01/2018, 6:37 PM

## 2018-04-01 NOTE — MAU Provider Note (Signed)
None     S: Ms. Valerie Ward is a 23 y.o. G1P0 at [redacted]w[redacted]d  who presents to MAU today complaining contractions q 2-5 minutes since 03/27/18 and worsening within the past few hours. She denies vaginal bleeding. She denies LOF. She reports normal fetal movement.    O: BP 112/74 (BP Location: Right Arm)   Pulse (!) 108   Temp 97.9 F (36.6 C)   Resp 20   Ht 5\' 4"  (1.626 m)   Wt 78.9 kg   LMP 06/26/2017   BMI 29.87 kg/m  GENERAL: Well-developed, well-nourished female in no acute distress.  HEAD: Normocephalic, atraumatic.  CHEST: Normal effort of breathing, regular heart rate ABDOMEN: Soft, nontender, gravid  Cervical exam:  Dilation: 1.5 Effacement (%): 80 Cervical Position: Anterior Station: -1 Presentation: Vertex Exam by:: Quintella Baton RNC    Fetal Monitoring: Baseline: 145 Variability: moderate Accelerations: positive Decelerations: none Contractions: irregular q 3-7 min  Meds ordered this encounter  Medications  . butorphanol (STADOL) injection 1 mg  . promethazine (PHENERGAN) injection 25 mg   A: SIUP at [redacted]w[redacted]d  False labor, cervix unchanged from previous MAU visits Reactive tracing Pain responsive to Stadol + Phenergan given in MAU  P: Discharge home in stable condition   Calvert Cantor, [place] 04/01/2018 5:14 AM

## 2018-04-01 NOTE — MAU Note (Signed)
I have communicated with Clayton Bibles CNM and reviewed vital signs:  Vitals:   04/01/18 0150 04/01/18 0332  BP: 112/74 112/72  Pulse: (!) 108 94  Resp: 20 18  Temp: 97.9 F (36.6 C)     Vaginal exam:  Dilation: 1.5(1.5 to tight 2cm) Effacement (%): 80 Cervical Position: Anterior Station: -1 Presentation: Vertex Exam by:: Quintella Baton RNC,   Also reviewed contraction pattern and that non-stress test is reactive.  It has been documented that patient is contracting every 5-12minutes with minimal cervical change over 1.5 hours not indicating active labor.  Patient denies any other complaints.  Based on this report provider has given order for discharge.  A discharge order and diagnosis entered by a provider.   Labor discharge instructions reviewed with patient.

## 2018-04-01 NOTE — Progress Notes (Signed)
Subjective:  Doing well, comfortable after epidural.  Thinks her water may have broken.  No complaints.  Objective: BP 110/71   Pulse (!) 102   Temp 98.2 F (36.8 C) (Oral)   Resp 16   Ht 5\' 4"  (1.626 m)   Wt 80 kg   LMP 06/26/2017   SpO2 97%   BMI 30.27 kg/m  No intake/output data recorded. No intake/output data recorded.  FHT: Category UC:   regular, every 3 minutes SVE:   Dilation: 6 Effacement (%): 100 Station: -2 Exam by:: E.Merek Niu,CNM Pitocin at n/a MVUs n/a Grossly ruptured  Assessment:   Active labor at term Reassuring FHR Epidural placed  Plan:  AROM of forbag, good progress, will place foley and utilize peanut ball.  Altamese Cabal CNM, MSN 04/01/2018, 2:44 PM

## 2018-04-01 NOTE — H&P (Signed)
Valerie Ward is a 23 y.o. female presenting for Labor. Was seen in MAU last night and was unchanged from prior visit so was given therapeutic rest and sent home.  Returns today with worsening contractions, has made significant cervical change and is coping poorly.  Will admit, plan for epidural and anticipate NSVD.   OB History    Gravida  1   Para      Term      Preterm      AB      Living        SAB      TAB      Ectopic      Multiple      Live Births             Past Medical History:  Diagnosis Date  . Medical history non-contributory    Past Surgical History:  Procedure Laterality Date  . NO PAST SURGERIES     Family History: family history is not on file. Social History:  reports that she has never smoked. She has never used smokeless tobacco. She reports that she does not drink alcohol or use drugs.     Maternal Diabetes: No Genetic Screening: Normal Maternal Ultrasounds/Referrals: Normal Fetal Ultrasounds or other Referrals:  None Maternal Substance Abuse:  No Significant Maternal Medications:  Meds include: Protonix Other: Hydroxyzine, Promethazine Significant Maternal Lab Results:  Lab values include: Other: Low platelets in first trimester, stable in 2nd and 3rd. Other Comments:  None  Review of Systems  All other systems reviewed and are negative.  Maternal Medical History:  Reason for admission: Contractions.   Contractions: Onset was 6-12 hours ago.   Frequency: regular.   Perceived severity is strong.    Fetal activity: Perceived fetal activity is normal.   Last perceived fetal movement was within the past hour.    Prenatal complications: Thrombocytopenia.     Dilation: 4 Effacement (%): 80 Station: -2 Exam by:: christina robinson rnc Blood pressure 104/76, pulse (!) 118, temperature 97.8 F (36.6 C), temperature source Oral, resp. rate 20, height 5\' 4"  (1.626 m), weight 80 kg, last menstrual period 06/26/2017. Maternal Exam:   Uterine Assessment: Contraction strength is moderate.  Contraction duration is 1 minute. Contraction frequency is regular.   Abdomen: Patient reports no abdominal tenderness. Estimated fetal weight is 7lb 5oz.   Fetal presentation: vertex  Introitus: Normal vulva. Normal vagina.  Ferning test: not done.  Nitrazine test: not done. Amniotic fluid character: not assessed.  Pelvis: adequate for delivery.      Fetal Exam Fetal Monitor Review: Mode: ultrasound.   Baseline rate: 120.  Variability: moderate (6-25 bpm).   Pattern: no accelerations and no decelerations.    Fetal State Assessment: Category I - tracings are normal.      Physical Exam  Vitals reviewed. Constitutional: She is oriented to person, place, and time. She appears well-developed and well-nourished.  HENT:  Head: Normocephalic.  Eyes: Pupils are equal, round, and reactive to light. Conjunctivae and EOM are normal.  Neck: Normal range of motion. Neck supple.  Cardiovascular: Normal rate, regular rhythm and normal heart sounds.  Respiratory: Effort normal and breath sounds normal.  GI: Soft. Bowel sounds are normal.  Genitourinary:    Vulva, vagina and uterus normal.   Musculoskeletal: Normal range of motion.  Neurological: She is alert and oriented to person, place, and time. She has normal reflexes.  Skin: Skin is warm and dry.  Psychiatric: She has a normal mood  and affect.       Prenatal labs: ABO, Rh: --/--/O POS Performed at [facility], [address], [place], [place] [address]  [phone]/04 1921) Antibody:  NEG Rubella:  IMMUNE  RPR:   NEG HBsAg:   NEG HIV:   NEG GBS:   NEG PLT - INITIALLY LOW @ 128, UP TO 158 AT 28 WKS, AND 219 AT 36WKS   Assessment/Plan:  Active labor at term GBS NEG Reassuring FHR status Plans epidural and will proceed with placement Anticipate NSVD   Altamese Cabal 04/01/2018, 11:32 AM

## 2018-04-01 NOTE — MAU Note (Signed)
Ctx started last night, husband reports they are frequent  Having bloody show, no LOF, + FM

## 2018-04-01 NOTE — Discharge Instructions (Signed)

## 2018-04-01 NOTE — Progress Notes (Signed)
Written and verbal d/c instructions given and understanding voiced. 

## 2018-04-01 NOTE — Anesthesia Preprocedure Evaluation (Signed)
Anesthesia Evaluation  Patient identified by MRN, date of birth, ID band Patient awake    Reviewed: Allergy & Precautions, Patient's Chart, lab work & pertinent test results  History of Anesthesia Complications Negative for: history of anesthetic complications  Airway Mallampati: II  TM Distance: >3 FB Neck ROM: Full    Dental  (+) Teeth Intact   Pulmonary neg pulmonary ROS,    Pulmonary exam normal breath sounds clear to auscultation       Cardiovascular negative cardio ROS Normal cardiovascular exam Rhythm:Regular Rate:Normal     Neuro/Psych negative neurological ROS  negative psych ROS   GI/Hepatic negative GI ROS, Neg liver ROS,   Endo/Other  negative endocrine ROS  Renal/GU negative Renal ROS  negative genitourinary   Musculoskeletal negative musculoskeletal ROS (+)   Abdominal   Peds negative pediatric ROS (+)  Hematology negative hematology ROS (+)   Anesthesia Other Findings   Reproductive/Obstetrics (+) Pregnancy                             Anesthesia Physical Anesthesia Plan  ASA: II  Anesthesia Plan: Epidural   Post-op Pain Management:    Induction:   PONV Risk Score and Plan: Treatment may vary due to age or medical condition  Airway Management Planned: Natural Airway  Additional Equipment:   Intra-op Plan:   Post-operative Plan:   Informed Consent: I have reviewed the patients History and Physical, chart, labs and discussed the procedure including the risks, benefits and alternatives for the proposed anesthesia with the patient or authorized representative who has indicated his/her understanding and acceptance.       Plan Discussed with: CRNA  Anesthesia Plan Comments:         Anesthesia Quick Evaluation  

## 2018-04-02 ENCOUNTER — Encounter (HOSPITAL_COMMUNITY): Payer: Self-pay | Admitting: *Deleted

## 2018-04-02 LAB — CBC
HCT: 30.4 % — ABNORMAL LOW (ref 36.0–46.0)
Hemoglobin: 10 g/dL — ABNORMAL LOW (ref 12.0–15.0)
MCH: 30.3 pg (ref 26.0–34.0)
MCHC: 32.9 g/dL (ref 30.0–36.0)
MCV: 92.1 fL (ref 80.0–100.0)
NRBC: 0 % (ref 0.0–0.2)
Platelets: 159 10*3/uL (ref 150–400)
RBC: 3.3 MIL/uL — ABNORMAL LOW (ref 3.87–5.11)
RDW: 15 % (ref 11.5–15.5)
WBC: 16.4 10*3/uL — AB (ref 4.0–10.5)

## 2018-04-02 LAB — RPR: RPR: NONREACTIVE

## 2018-04-02 MED ORDER — PRENATAL MULTIVITAMIN CH
1.0000 | ORAL_TABLET | Freq: Every day | ORAL | Status: DC
  Administered 2018-04-02 – 2018-04-04 (×3): 1 via ORAL
  Filled 2018-04-02 (×3): qty 1

## 2018-04-02 MED ORDER — ONDANSETRON HCL 4 MG PO TABS: 4.0000 mg | ORAL_TABLET | ORAL | Status: DC | PRN

## 2018-04-02 MED ORDER — ACETAMINOPHEN 325 MG PO TABS
650.0000 mg | ORAL_TABLET | ORAL | Status: DC | PRN
  Administered 2018-04-02 – 2018-04-04 (×6): 650 mg via ORAL
  Filled 2018-04-02 (×6): qty 2

## 2018-04-02 MED ORDER — COCONUT OIL OIL
1.0000 "application " | TOPICAL_OIL | Status: DC | PRN
  Filled 2018-04-02: qty 120

## 2018-04-02 MED ORDER — IBUPROFEN 600 MG PO TABS
600.0000 mg | ORAL_TABLET | Freq: Four times a day (QID) | ORAL | Status: DC
  Administered 2018-04-02 – 2018-04-04 (×10): 600 mg via ORAL
  Filled 2018-04-02 (×10): qty 1

## 2018-04-02 MED ORDER — BENZOCAINE-MENTHOL 20-0.5 % EX AERO
1.0000 "application " | INHALATION_SPRAY | CUTANEOUS | Status: DC | PRN
  Administered 2018-04-02: 1 via TOPICAL
  Filled 2018-04-02: qty 56

## 2018-04-02 MED ORDER — DIPHENHYDRAMINE HCL 25 MG PO CAPS: 25.0000 mg | ORAL_CAPSULE | Freq: Four times a day (QID) | ORAL | Status: DC | PRN

## 2018-04-02 MED ORDER — ONDANSETRON HCL 4 MG/2ML IJ SOLN: 4.0000 mg | INTRAMUSCULAR | Status: DC | PRN

## 2018-04-02 MED ORDER — SIMETHICONE 80 MG PO CHEW: 80.0000 mg | CHEWABLE_TABLET | ORAL | Status: DC | PRN

## 2018-04-02 MED ORDER — ZOLPIDEM TARTRATE 5 MG PO TABS: 5.0000 mg | ORAL_TABLET | Freq: Every evening | ORAL | Status: DC | PRN

## 2018-04-02 MED ORDER — WITCH HAZEL-GLYCERIN EX PADS: 1.0000 "application " | MEDICATED_PAD | CUTANEOUS | Status: DC | PRN

## 2018-04-02 MED ORDER — DIBUCAINE 1 % RE OINT: 1.0000 "application " | TOPICAL_OINTMENT | RECTAL | Status: DC | PRN

## 2018-04-02 MED ORDER — TETANUS-DIPHTH-ACELL PERTUSSIS 5-2.5-18.5 LF-MCG/0.5 IM SUSP: 0.5000 mL | Freq: Once | INTRAMUSCULAR | Status: DC

## 2018-04-02 MED ORDER — MISOPROSTOL 200 MCG PO TABS
ORAL_TABLET | ORAL | Status: AC
  Filled 2018-04-02: qty 5

## 2018-04-02 MED ORDER — SENNOSIDES-DOCUSATE SODIUM 8.6-50 MG PO TABS
2.0000 | ORAL_TABLET | ORAL | Status: DC
  Administered 2018-04-03 – 2018-04-04 (×2): 2 via ORAL
  Filled 2018-04-02 (×2): qty 2

## 2018-04-02 NOTE — Progress Notes (Signed)
Labor Progress Note Ms, Valerie Ward is a 23 year old G1P0 at 39+6 weeks, admitted for active labor.  Subjective:  Ms. Valerie Ward is comfortable with her epidural and has been pushing for about an hour. Though she is tired, she is still in good spirits and pushing effectively.   Objective:  BP 107/85   Pulse (!) 118   Temp 98.2 F (36.8 C) (Oral)   Resp 16   Ht 5\' 4"  (1.626 m)   Wt 80 kg   LMP 06/26/2017   SpO2 100%   BMI 30.27 kg/m     FHT: Category 2, small variables with pushing. Moderate variable and accels present. UC:   2-3 in 10 minutes and lasting 1-3 minutes long SVE:   Dilation: 10 Effacement (%): 100 Station: Plus 2 Exam by:: Valerie Ward CNM Pitocin at n/a MVUs n/a  Assessment/Plan:  Fetal Wellbeing: reassuring tracing Labor: progressing well Preeclampsia:  n/a GBS:   neg Pain Control:  epidural Anticipated MOD:  SVD    Jonetta Speakarol Hong Timm, CNM 01/31/2019, 2200

## 2018-04-02 NOTE — Lactation Note (Addendum)
This note was copied from a baby's chart. Lactation Consultation Note  Patient Name: Valerie Ward [id number] Date: 04/02/2018 Reason for consult: Initial assessment;1st time breastfeeding P1, 19 hour female infant. Per parents, infant had one wet and 2 stools since delivery. Parents have not been writing down input , output and breastfeeding frequency. LC reinforced importance of documenting yellow sheet with parents. Per mom, infant not been latching well to breast and not sustaining latch. Mom has been attempting to breastfeed and supplementing with formula. LC entered room infant cuing to feed and became very fussy. Mom attempted latch infant cross cradle hold infant would not sustain latch. Mom hand expressed 2 ml of colostrum that was spoon feed to infant, infant started to calm down and mom re-latched infant to breast.  Mom switch to football hold, after several attempts, infant sustain latch and started sucking in rthymitic pattern for 20 minutes. Infant still little fussy  after breastfeeding for 20 minutes and mom has sensitive breast and wanted to end the breastfeeding. Mom's breast  doesn't have blister or cuts. Infant was supplemented with 5 ml of gerber with iron by curve tip syringe. Mom will continue to work on latching infant to breast will ask Nurse or LC for assistance if she has any questions, concerns or problems with latching infant.  Mom will breastfeed according hunger cues, 8 to 12 times within 24 hours including nights, and not exceed 3 hours without breastfeeding infant. LC discussed I& O. Reviewed Baby & Me book's Breastfeeding Basics.  Mom made aware of O/P services, breastfeeding support groups, community resources, and our phone # for post-discharge questions.   Maternal Data Formula Feeding for Exclusion: No Has patient been taught Hand Expression?: Yes(Infant given 2 ml of EBM on spoon.)  Feeding Feeding Type: Breast Fed  LATCH Score Latch: Repeated  attempts needed to sustain latch, nipple held in mouth throughout feeding, stimulation needed to elicit sucking reflex.  Audible Swallowing: A few with stimulation  Type of Nipple: Everted at rest and after stimulation  Comfort (Breast/Nipple): Soft / non-tender  Hold (Positioning): Assistance needed to correctly position infant at breast and maintain latch.  LATCH Score: 7  Interventions Interventions: Breast feeding basics reviewed;Assisted with latch;Breast massage;Hand express;Adjust position;Support pillows;Position options;Expressed milk;Hand pump  Lactation Tools Discussed/Used Tools: 63F feeding tube / Syringe(formula 31m given by curve tip syringe.) WIC Program: No(Mom is interested in applying for [facility].) Pump Review: Setup, frequency, and cleaning;Milk Storage Initiated by:: Danelle Earthly, IBCLC)   Consult Status Consult Status: Follow-up Date: 04/03/18 Follow-up type: In-patient    Danelle Earthly 04/02/2018, 9:44 PM

## 2018-04-02 NOTE — Anesthesia Postprocedure Evaluation (Signed)
Anesthesia Post Note  Patient: Valerie Ward  Procedure(s) Performed: AN AD HOC LABOR EPIDURAL     Patient location during evaluation: Mother Baby Anesthesia Type: Epidural Level of consciousness: awake and alert and oriented Pain management: satisfactory to patient Vital Signs Assessment: post-procedure vital signs reviewed and stable Respiratory status: respiratory function stable Cardiovascular status: stable Postop Assessment: no headache, no backache, epidural receding, patient able to bend at knees, no signs of nausea or vomiting and adequate PO intake Anesthetic complications: no    Last Vitals:  Vitals:   04/02/18 0400 04/02/18 0458  BP: 110/75 110/67  Pulse: (!) 121 (!) 119  Resp: 18 18  Temp: 36.9 C 37.4 C  SpO2: 100% 99%    Last Pain:  Vitals:   04/02/18 0715  TempSrc:   PainSc: Asleep   Pain Goal: Patients Stated Pain Goal: 5 (04/02/18 0345)               Karleen Dolphin

## 2018-04-03 NOTE — Progress Notes (Signed)
CSW acknowledged consult and attempted to meet with MOB to complete assessment. MOB accompanied by 2 guests that reported that MOB was asleep. CSW will attempt to meet with MOB at a later time.  Lizett Chowning, LCSWA Clinical Social Worker [facility] Cell#: [phone] 

## 2018-04-03 NOTE — Progress Notes (Signed)
Subjective: Postpartum Day 1: Vaginal delivery,  laceration Patient up ad lib, reports no syncope or dizziness. Feeding:  breast Contraceptive plan: none Pt states infant irritable and breastfeeding not satisfying infant.  Using Motrin and Tylenol for discomfort with some relief.  States tired.  Plans on staying until tomorrow. Objective: Vital signs in last 24 hours: Temp:  [97.8 F (36.6 C)-98.3 F (36.8 C)] 98.1 F (36.7 C) (01/14 0541) Pulse Rate:  [92-113] 96 (01/14 0541) Resp:  [16-18] 17 (01/13 2240) BP: (96-101)/(65-68) 96/66 (01/14 0541) SpO2:  [99 %-100 %] 99 % (01/14 0541)  Physical Exam:  General: Alert, cooperative and some distress due to lack of sleep Lochia: appropriate Uterine Fundus: firm Perineum: healing well DVT Evaluation: No evidence of DVT seen on physical exam.   CBC Latest Ref Rng & Units 04/02/2018 04/01/2018 09/21/2017  WBC 4.0 - 10.5 K/uL 16.4(H) 15.6(H) 11.1(H)  Hemoglobin 12.0 - 15.0 g/dL 10.0(L) 13.3 12.1  Hematocrit 36.0 - 46.0 % 30.4(L) 40.8 36.1  Platelets 150 - 400 K/uL 159 199 138(L)     Assessment/Plan: Status post vaginal delivery day 1. Stable Continue current care. Plan for discharge tomorrow and Breastfeeding consultation.Declines circumcision.    Henderson Newcomer ProtheroCNM 04/03/2018, 10:11 AM

## 2018-04-03 NOTE — Lactation Note (Signed)
This note was copied from a baby's chart. Lactation Consultation Note  Patient Name: Valerie Ward [id number] Date: 04/03/2018 Reason for consult: Follow-up assessment;Term;Nipple pain/trauma;Primapara;1st time breastfeeding;Difficult latch;Infant weight loss Baby 36 hours old and baby had not fed since 0900. 3% weight loss at this time Changed baby's wet diaper and placed baby STS on left breast in football hold. Assessed mom's breast tissue, nipple erect, areola semi-compressible with mild edema. Asked mom to perform breast massage, hand expression, and reverse pressure. Initially latched baby and mother with extreme discomfort. Due to discomfort, released the baby from mom and also demonstrated this to mom. Instead of curved-tip syringe, initiated 60Fr feeding tube with feed and mother's level of comfort significantly improved.  Baby fed for 7 min and took in 36ml from syringe; content after feeding and released on his own. Nipple well rounded following feed. To keep it simple for this mom, LC instructed and recommended prior to latch-good breast massage, hand expression, pre-pump with manual pump to make nipple/areola complex more elastic, reverse pressure then latch, add 60Fr feeding tube and demonstrated use to FOB as well. Report given to Hermelinda Medicus, RN for follow-up care.  Maternal Data Has patient been taught Hand Expression?: Yes Does the patient have breastfeeding experience prior to this delivery?: No  Feeding Feeding Type: Breast Milk with Formula added  LATCH Score Latch: Repeated attempts needed to sustain latch, nipple held in mouth throughout feeding, stimulation needed to elicit sucking reflex.  Audible Swallowing: Spontaneous and intermittent  Type of Nipple: Everted at rest and after stimulation(semi-compressible areola)  Comfort (Breast/Nipple): Soft / non-tender  Hold (Positioning): Assistance needed to correctly position infant at breast and maintain  latch.  LATCH Score: 8  Interventions Interventions: Breast feeding basics reviewed;Assisted with latch;Skin to skin;Breast massage;Hand express;Reverse pressure;Breast compression;Support pillows;Shells;Hand pump  Lactation Tools Discussed/Used Tools: 60F feeding tube / Syringe;Shells;Pump Shell Type: Inverted Breast pump type: Manual Pump Review: Setup, frequency, and cleaning(reviewed)   Consult Status Consult Status: Follow-up Date: 04/04/18 Follow-up type: In-patient    Virgia Land 04/03/2018, 2:07 PM

## 2018-04-04 MED ORDER — IBUPROFEN 600 MG PO TABS: 600.0000 mg | ORAL_TABLET | Freq: Four times a day (QID) | ORAL | 0 refills | Status: DC

## 2018-04-04 NOTE — Lactation Note (Signed)
This note was copied from a baby's chart. Lactation Consultation Note  Patient Name: Valerie Ward [id number] Date: 04/04/2018 Reason for consult: Follow-up assessment;Primapara;Term;Difficult latch;Nipple pain/trauma  Visited with [name] of term baby on day of discharge, baby 39 hrs old and at 3% weight loss.  Mom has been consistently supplementing baby with formula, and has transitioned to bottle feeding due to extreme pain with latching.    Mom feeding baby formula and showed her how to sit baby upright and pace feed baby.   Mom has a DEBP set up at bedside and has been instructed to pump at each feeding if baby isn't latching to breast.    Mom does not have a pump for home use.  Referral sent to [facility] to see if she could be signed up and obtain a pump.  Explained how [facility] loaner pumps from Lactation work, and Mom didn't say if she was interested.  Mom and FOB are very interested in obtaining a pump from [facility] today if that is possible.   Mom also explained how OP lactation follow-up would work.  Mom is interested.  Referral to OP lactation sent.    Will show Mom how to disassemble pump parts and show Mom how to use as a manual double pump.    Mom aware of importance of pumping >8 times per 24 hrs if baby isn't latching well to breast.  Baby to be supplemented per volume guidelines given.    Engorgement prevention and treatment discussed.   Mom knows to call prn for concerns.   Consult Status Consult Status: Complete Date: 04/04/18 Follow-up type: Out-patient    Judee Clara 04/04/2018, 9:06 AM

## 2018-04-04 NOTE — Progress Notes (Signed)
Per chart review, patient needs a Nepali interpreter. CSW spoke with patient's RN regarding need for an interpreter for patient, RN reported that patient has not needed an interpreter thus far.   CSW received consult due to score 10 on Edinburgh Depression Screen.    CSW met with MOB at bedside, FOB present. CSW asked MOB if she needed a Nepali interpreter, MOB declined need for interpreter. CSW asked FOB to leave during assessment with MOB permission, FOB left voluntarily. CSW introduced self and explained reason for consult. MOB welcoming and engaged. MOB denied any mental health history. CSW inquired about MOB's support system, MOB reported that her husband and family are her supports. CSW asked if MOB needed anything for the baby, MOB reported that she needed a breast pump from [facility]. CSW agreed to notify MOB's RN. MOB presented calm and did not demonstrate any acute mental health signs/symptoms. CSW assessed for safety, MOB denied SI, HI and domestic violence.   CSW provided education regarding Baby Blues vs PMADs and provided MOB with resources for mental health follow up.  CSW encouraged MOB to evaluate her mental health throughout the postpartum period with the use of the New Mom Checklist developed by [facility] as well as the Edinburgh Postnatal Depression Scale and notify a medical professional if symptoms arise.    CSW notified MOB's RN that MOB needed to see W[facility] for a breast pump.  CSW identifies no further need for intervention and no barriers to discharge at this time.  Daryl Quiros, LCSWA Clinical Social Worker [facility] Cell#: [phone]           

## 2018-04-04 NOTE — Progress Notes (Signed)
Patient and significant other decline need for interpreter. Patient and significant other respond appropriately in conversation. Valerie Ward, Valerie Ward

## 2018-04-04 NOTE — Discharge Summary (Signed)
SVD OB Discharge Summary     Patient Name: Valerie LusterMadhu Burditt DOB: 11/06/1995 MRN: [id number]  Date of admission: 04/01/2018 Delivering MD: Altamese CabalBAIRD, ERIN M  Date of delivery: 04/02/2018 Type of delivery: SVD  Newborn Data: Sex: Baby Female  Circumcision: Not desired Live born female  Birth Weight: 8 lb 3.6 oz (3731 g) APGAR: 8, 9  Newborn Delivery   Birth date/time:  04/02/2018 01:13:00 Delivery type:  Vaginal, Spontaneous     Feeding: breast and bottle Infant being discharge to home with mother in stable condition.   Admitting diagnosis: DUE JAN 2020 CTX Intrauterine pregnancy: 1658w0d     Secondary diagnosis:  Active Problems:   Supervision of normal first pregnancy   Normal delivery   First degree laceration of perineum during delivery, postpartum   Postpartum normal course                                Complications: Hemorrhage>103700mL     EBL 925                                                         Intrapartum Procedures: spontaneous vaginal delivery Postpartum Procedures: none Complications-Operative and Postpartum: 1st degree degree perineal laceration Augmentation: None   History of Present Illness: Ms. Valerie LusterMadhu Dancer is a 23 y.o. female, G1P1001, who presents at 1358w0d weeks gestation. [facility]  Her pregnancy has been complicated by: Patient Active Problem List   Diagnosis Date Noted  . Postpartum normal course 04/04/2018  . Normal delivery 04/02/2018  . First degree laceration of perineum during delivery, postpartum 04/02/2018  . Supervision of normal first pregnancy 04/01/2018   Hospital course:  Onset of Labor With Vaginal Delivery     23 y.o. yo G1P1001 at 2458w0d was admitted in Latent Labor on 04/01/2018. Patient had an uncomplicated labor course as follows:  Membrane Rupture Time/Date: 2:05 PM ,04/01/2018   Intrapartum Procedures: Episiotomy: None [1]   Lacerations:  1st degree [2]  Patient had a delivery of a Viable infant. 04/02/2018  Information for the patient's newborn:  Deitra MayoShah, Boy Lindalee [[id number]][[id number]]  Delivery Method: Vaginal, Spontaneous(Filed from Delivery Summary)    Pateint had an uncomplicated postpartum course.  She is ambulating, tolerating a regular diet, passing flatus, and urinating well. Patient is discharged home in stable condition on 04/04/18.  Postpartum Day # 3 : S/P NSVD due to latent spontaneous labor. Patient up ad lib, denies syncope or dizziness. Reports consuming regular diet without issues and denies N/V. Patient reports 0 bowel movement + passing flatus.  Denies issues with urination and reports bleeding is "lighter."  Patient is breast and bottle feeding and reports going well.  Desires undecided for postpartum contraception.  Pain is being appropriately managed with use of po meds. Post delivery HGB was 10, pt stable.   Physical exam  Vitals:   04/03/18 0541 04/03/18 1430 04/03/18 2242 04/04/18 0612  BP: 96/66 101/65 95/69 99/62   Pulse: 96 (!) 103 99 97  Resp:  18    Temp: 98.1 F (36.7 C) 98.1 F (36.7 C) 98.1 F (36.7 C) 97.8 F (36.6 C)  TempSrc: Oral Oral Oral Oral  SpO2: 99%  100% 100%  Weight:  Height:       General: alert, cooperative and no distress Lochia: appropriate Uterine Fundus: firm Perineum: approximate, no hematomas noted DVT Evaluation: No evidence of DVT seen on physical exam. Negative Homan's sign. No cords or calf tenderness. No significant calf/ankle edema.  Labs: Lab Results  Component Value Date   WBC 16.4 (H) 04/02/2018   HGB 10.0 (L) 04/02/2018   HCT 30.4 (L) 04/02/2018   MCV 92.1 04/02/2018   PLT 159 04/02/2018   No flowsheet data found.  Date of discharge: 04/04/2018 Discharge Diagnoses: Term Pregnancy-delivered Discharge instruction: per After Visit Summary and "Baby and Me Booklet".  After visit meds:  Allergies as of 04/04/2018   No Known Allergies      Medication List    TAKE these medications   ibuprofen 600 MG tablet Commonly known as:  ADVIL,MOTRIN Take 1 tablet (600 mg total) by mouth every 6 (six) hours.   prenatal multivitamin Tabs tablet Take 1 tablet by mouth daily at 12 noon.       Activity:           unrestricted and pelvic rest Advance as tolerated. Pelvic rest for 6 weeks.  Diet:                routine Medications: PNV and Ibuprofen Postpartum contraception: Undecided Condition:  Pt discharge to home with baby in stable  Meds: Allergies as of 04/04/2018   No Known Allergies     Medication List    TAKE these medications   ibuprofen 600 MG tablet Commonly known as:  ADVIL,MOTRIN Take 1 tablet (600 mg total) by mouth every 6 (six) hours.   prenatal multivitamin Tabs tablet Take 1 tablet by mouth daily at 12 noon.       Discharge Follow Up:  Follow-up Information    [facility] Follow up in 6 week(s).   Specialty:  Obstetrics and Gynecology Contact information: [address] [place] [address] [phone]           [place], NP-C, CNM 04/04/2018, 10:16 AM  Dale Kotlik, FNP

## 2018-04-05 ENCOUNTER — Encounter (HOSPITAL_COMMUNITY): Payer: Self-pay | Admitting: *Deleted

## 2018-04-05 ENCOUNTER — Inpatient Hospital Stay (HOSPITAL_COMMUNITY)
Admission: AD | Admit: 2018-04-05 | Discharge: 2018-04-05 | Disposition: A | Discharge status: Home / Self Care | Payer: Medicaid Other | Source: Ambulatory Visit | Attending: Obstetrics and Gynecology | Admitting: Obstetrics and Gynecology

## 2018-04-05 DIAGNOSIS — O8612 Endometritis following delivery: Secondary | ICD-10-CM | POA: Insufficient documentation

## 2018-04-05 DIAGNOSIS — N6459 Other signs and symptoms in breast: Secondary | ICD-10-CM

## 2018-04-05 DIAGNOSIS — O9279 Other disorders of lactation: Secondary | ICD-10-CM | POA: Diagnosis not present

## 2018-04-05 DIAGNOSIS — R102 Pelvic and perineal pain: Secondary | ICD-10-CM | POA: Diagnosis present

## 2018-04-05 MED ORDER — DICLOFENAC SODIUM 75 MG PO TBEC: 75.0000 mg | DELAYED_RELEASE_TABLET | Freq: Two times a day (BID) | ORAL | 0 refills | Status: DC

## 2018-04-05 MED ORDER — AMOXICILLIN-POT CLAVULANATE 875-125 MG PO TABS: 1.0000 | ORAL_TABLET | Freq: Two times a day (BID) | ORAL | 0 refills | Status: DC

## 2018-04-05 MED ORDER — BENZOCAINE-MENTHOL 20-0.5 % EX AERO
1.0000 "application " | INHALATION_SPRAY | Freq: Four times a day (QID) | CUTANEOUS | Status: DC | PRN
  Filled 2018-04-05: qty 56

## 2018-04-05 NOTE — Discharge Instructions (Signed)
Breast Engorgement Breast engorgement is the overfilling of your breasts with breast milk. It is usually caused by delaying feedings, which can cause milk to build up. Breast engorgement can happen at any time while you are breast feeding, and is normal in the first 3-5 days after giving birth. The condition can make your breasts feel heavy, full, hard, tightly stretched, warm, and tender. Breast engorgement should improve within 24-48 hours of feeding your baby or expressing your milk. Follow these instructions at home: When to breastfeed or pump  Breastfeed when your baby shows signs of hunger. This is called "breastfeeding on demand."  Breastfeed or use a breast pump to remove milk from your breasts when you feel the need to reduce the fullness of your breasts.  If your baby is younger than 1 month, make sure you are breastfeeding every 1-3 hours during the day. You may need to wake up your baby to feed if he or she is asleep at a feeding time.  Do not allow your baby to sleep longer than 5 hours during the night without a feeding.  Do not delay feedings.  If you are returning to work or are away from home for an extended period, try to pump your milk on the same schedule as when your baby would breastfeed. Before breastfeeding or pumping:  Increase the circulation in your breasts and help your milk flow. Try either of these methods: ? Taking a warm shower. ? Applying warm, water-soaked hand towels to your breasts. ? Massaging your breasts.  Pump or hand-express breast milk before breastfeeding to soften your breast, areola, and nipple. During breastfeeding or pumping:  Try to relax when it is time to feed your baby. This helps to trigger your "let-down reflex," which releases milk from your breast.  Ensure your baby is latched on to your breast and positioned properly while breastfeeding.  Empty your breasts completely when breastfeeding or pumping.  Allow your baby to remain at  your breast as long as he or she is latched on well and sucking. Your baby will let you know when he or she is done breastfeeding by pulling away from your breast or falling asleep.  Massage your breasts to help your milk flow. Managing pain and swelling   Take over-the-counter and prescription medicines only as told by your health care provider.  If directed, put ice on your breasts: ? Put ice in a plastic bag. ? Place a towel between your skin and the bag. ? Leave the ice on for 20 minutes, 2-3 times a day.  If you feel pain while breastfeeding, take your baby off your breast and try again. General instructions  After breastfeeding or pumping wear a snug bra or tank top for 1-2 days. This will signal your body to slightly decrease how much milk it makes. Once the engorgement passes, make sure you to wear a well-fitted, supportive bra and regular clothes.  Drink enough fluid to keep your urine clear or pale yellow.  Avoid introducing bottles or pacifiers to your baby in the early weeks of breastfeeding. Wait to introduce these things until after resolving any breastfeeding challenges. Contact a health care provider if:  Engorgement lasts longer than 2 days, even after treatment.  You have flu-like symptoms, such as a fever, chills, or body aches.  You have nausea or you vomit.  Your breasts become red and painful.  You have a lump in your breast.  Your nipples continue to crack or start to   ooze.  There is yellow discharge coming from a nipple.  You have pain while breastfeeding, and it does not go away once you take your baby off your breast and try again. Get help right away if:  There is pus or blood in your breast milk.  You have sudden, severe symptoms.  You have red streaks near your breast.  Both breasts appear infected and you cannot breastfeed. Summary  Breast engorgement is the overfilling of your breasts with breast milk. It is usually caused by delayed  feeding.  Although it is normal to experience breast engorgement 3-5 days after giving birth, it can happen at any time while breastfeeding.  Do not delay feedings. Breastfeed on demand to help prevent engorgement.  Increase the circulation in your breasts and help your milk flow before feeding your baby. You can do this by taking a warm shower, applying warm water-soaked hand towels, or massaging your breasts. This information is not intended to replace advice given to you by your health care provider. Make sure you discuss any questions you have with your health care provider. Document Released: 07/02/2004 Document Revised: 04/11/2016 Document Reviewed: 04/11/2016 Elsevier Interactive Patient Education  2019 Elsevier Inc. Endometritis  Endometritis is irritation, soreness, or inflammation that affects the lining of the uterus (endometrium). Infection is usually the cause of endometritis. It is important to get treatment to prevent complications. Common complications may include more severe infections and not being able to have children(infertility). What are the causes? This condition may be caused by:  Bacterial infections.  STIs (sexually transmitted infections).  A miscarriage or childbirth, especially after a long labor or cesarean delivery.  Certain gynecological procedures. These may include dilation and curettage (D&C), hysteroscopy, or birth control (contraceptive) insertion.  Tuberculosis (TB). What are the signs or symptoms? Symptoms of this condition include:  Fever.  Lower abdomen (abdominal) pain.  Pelvis (pelvic) pain.  Abnormal vaginal discharge or bleeding.  Abdominal bloating (distention) or swelling.  General discomfort or generally feeling ill.  Discomfort with bowel movements.  Constipation. How is this diagnosed? This condition may be diagnosed based on:  A physical exam, including a pelvic exam.  Tests, such as: ? Blood tests. ? Removal of a  sample of endometrial tissue for testing (endometrial biopsy). ? Examining a sample of vaginal discharge under a microscope (wet prep). ? Removal of a sample of fluid from the cervix for testing (cervical culture). ? Surgical examination of the pelvis and abdomen. How is this treated? This condition is treated with:  Antibiotic medicines.  For more severe cases, hospitalization may be needed to give fluids and antibiotics directly into a vein through an IV tube. Follow these instructions at home:  Take over-the-counter and prescription medicines only as told by your health care provider.  Drink enough fluid to keep your urine clear or pale yellow.  Take your antibiotic medicine as told by your health care provider. Do not stop taking the antibiotic even if you start to feel better.  Do not douche or have sex (including vaginal, oral, and anal sex) until your health care provider approves.  If your endometritis was caused by an STI, do not have sex (including vaginal, oral, and anal sex) until your partner has also been treated for the STI.  Return to your normal activities as told by your health care provider. Ask your health care provider what activities are safe for you.  Keep all follow-up visits as told by your health care provider. This is  important. Contact a health care provider if:  You have pain that does not get better with medicine.  You have a fever.  You have pain with bowel movements. Get help right away if:  You have abdominal swelling.  You have abdominal pain that gets worse.  You have bad-smelling vaginal discharge, or an increased amount of vaginal discharge.  You have abnormal vaginal bleeding.  You have nausea and vomiting. Summary  Endometritis affects the lining of the uterus (endometrium) and is usually caused by an infection.  It is important to get treatment to prevent complications.  You have several treatment options for endometritis.  Treatment may include antibiotics and IV fluids.  Take your antibiotic medicine as told by your health care provider. Do not stop taking the antibiotic even if you start to feel better.  Do not douche or have sex (including vaginal, oral, and anal sex) until your health care provider approves. This information is not intended to replace advice given to you by your health care provider. Make sure you discuss any questions you have with your health care provider. Document Released: 03/01/2001 Document Revised: 03/22/2016 Document Reviewed: 03/22/2016 Elsevier Interactive Patient Education  2019 [employer].

## 2018-04-05 NOTE — MAU Note (Signed)
PP 04/02/2018. Pt c/o increased vaginal  pain  And increased vag bleeding and discharge. changeing pad q 3 hrs. C/o headache (not relieved by ibuprofen or tylenol.)

## 2018-04-05 NOTE — MAU Provider Note (Signed)
[facility] Attending MAU Note  Chief Complaint: Vaginal Pain    First Provider Initiated Contact with Patient 04/05/18 1810      SUBJECTIVE Valerie Ward is a 23 y.o. G1P1001 3 days pp from SVD. Went home on yesterday and was feeling well who now  presents with 1 day of increasing vulvar pain. Has tried tylenol and Ibuprofen and pain has worsened. She is unable to walk. Denies fever chills. Pain is with urination. Had 1st degree. Reports perineal swelling. Bleeding has lightened up. Also with breast engorgement. Baby will not latch.  Past Medical History:  Diagnosis Date  . Medical history non-contributory    OB History  Gravida Para Term Preterm AB Living  1 1 1     1   SAB TAB Ectopic Multiple Live Births        0 1    # Outcome Date GA Lbr Len/2nd Weight Sex Delivery Anes PTL Lv  1 Term 04/02/18 [redacted]w[redacted]d 17:25 / 04:18 3731 g M Vag-Spont EPI  LIV     Birth Comments: WNL   Past Surgical History:  Procedure Laterality Date  . NO PAST SURGERIES     Social History   Socioeconomic History  . Marital status: Married    Spouse name: Not on file  . Number of children: Not on file  . Years of education: Not on file  . Highest education level: Not on file  Occupational History  . Not on file  Social Needs  . Financial resource strain: Not on file  . Food insecurity:    Worry: Not on file    Inability: Not on file  . Transportation needs:    Medical: Not on file    Non-medical: Not on file  Tobacco Use  . Smoking status: Never Smoker  . Smokeless tobacco: Never Used  Substance and Sexual Activity  . Alcohol use: Never    Frequency: Never  . Drug use: Never  . Sexual activity: Yes  Lifestyle  . Physical activity:    Days per week: Not on file    Minutes per session: Not on file  . Stress: Not on file  Relationships  . Social connections:    Talks on phone: Not on file    Gets together: Not on file    Attends religious service: Not on file    Active member  of club or organization: Not on file    Attends meetings of clubs or organizations: Not on file    Relationship status: Not on file  . Intimate partner violence:    Fear of current or ex partner: Not on file    Emotionally abused: Not on file    Physically abused: Not on file    Forced sexual activity: Not on file  Other Topics Concern  . Not on file  Social History Narrative  . Not on file   No current facility-administered medications on file prior to encounter.    Current Outpatient Medications on File Prior to Encounter  Medication Sig Dispense Refill  . ibuprofen (ADVIL,MOTRIN) 600 MG tablet Take 1 tablet (600 mg total) by mouth every 6 (six) hours. 30 tablet 0  . Prenatal Vit-Fe Fumarate-FA (PRENATAL MULTIVITAMIN) TABS tablet Take 1 tablet by mouth daily at 12 noon.     No Known Allergies  ROS: Pertinent items in HPI  OBJECTIVE BP 126/64   Pulse 94   Temp 98.6 F (37 C)   Resp 18   Ht 5\' 4"  (1.626  m)   Wt 80.3 kg   LMP 06/26/2017   BMI 30.38 kg/m  CONSTITUTIONAL: Well-developed, well-nourished female in no acute distress.  HENT:  Normocephalic, atraumatic, External right and left ear normal. Oropharynx is clear and moist EYES: Conjunctivae and EOM are normal. Pupils are equal, round, and reactive to light. No scleral icterus.  NECK: Normal range of motion, supple, no masses.  Normal thyroid.  SKIN: Skin is warm and dry. No rash noted. Not diaphoretic. No erythema. No pallor. NEUROLGIC: Alert and oriented to person, place, and time. Normal reflexes, muscle tone coordination. No cranial nerve deficit noted. PSYCHIATRIC: Normal mood and affect. Normal behavior. Normal judgment and thought content. CARDIOVASCULAR: Normal heart rate noted RESPIRATORY: Effort and breath sounds normal, no problems with respiration noted. ABDOMEN: Soft, normal bowel sounds, no distention noted.  No tenderness, rebound or guarding.  PELVIC: Normal appearing external genitalia; the first  degree has some exudate and is somewhat separated. It is quite tender. Uterus is tender as well. MUSCULOSKELETAL: Normal range of motion. No tenderness.  No cyanosis, clubbing, or edema.  2+ distal pulses.  LAB RESULTS No results found for this or any previous visit (from the past 48 hour(s)).  IMAGING No results found.  MAU COURSE  ASSESSMENT 1. First degree laceration of perineum during delivery, postpartum   2. Breast engorgement   3. Postpartum endometritis     PLAN Discharge home Augmentin 875/125 bid x 7 d Change to Voltaren Dermoplast given to patient Breast engorgement treatment options discussed.  Follow-up Information    [facility] Follow up in 6 week(s).   Specialty:  Obstetrics and Gynecology Why:  keep postpartum appontment Contact information: [address] [place] [address] [phone]         Allergies as of 04/05/2018   No Known Allergies     Medication List    STOP taking these medications   ibuprofen 600 MG tablet Commonly known as:  ADVIL,MOTRIN     TAKE these medications   amoxicillin-clavulanate 875-125 MG tablet Commonly known as:  AUGMENTIN Take 1 tablet by mouth every 12 (twelve) hours.   diclofenac 75 MG EC tablet Commonly known as:  VOLTAREN Take 1 tablet (75 mg total) by mouth 2 (two) times daily.   prenatal multivitamin Tabs tablet Take 1 tablet by mouth daily at 12 noon.        Reva Bores, MD 04/05/2018 6:27 PM

## 2018-04-13 ENCOUNTER — Inpatient Hospital Stay (HOSPITAL_COMMUNITY)
Admission: AD | Admit: 2018-04-13 | Discharge: 2018-04-13 | Disposition: A | Discharge status: Home / Self Care | Payer: Medicaid Other | Attending: Obstetrics & Gynecology | Admitting: Obstetrics & Gynecology

## 2018-04-13 ENCOUNTER — Encounter (HOSPITAL_COMMUNITY): Payer: Self-pay | Admitting: *Deleted

## 2018-04-13 DIAGNOSIS — N766 Ulceration of vulva: Secondary | ICD-10-CM

## 2018-04-13 DIAGNOSIS — O8609 Infection of obstetric surgical wound, other surgical site: Secondary | ICD-10-CM | POA: Diagnosis not present

## 2018-04-13 DIAGNOSIS — O8600 Infection of obstetric surgical wound, unspecified: Secondary | ICD-10-CM | POA: Diagnosis not present

## 2018-04-13 DIAGNOSIS — R102 Pelvic and perineal pain: Secondary | ICD-10-CM | POA: Diagnosis present

## 2018-04-13 MED ORDER — VALACYCLOVIR HCL 1 G PO TABS: 1000.0000 mg | ORAL_TABLET | Freq: Two times a day (BID) | ORAL | 0 refills | Status: AC

## 2018-04-13 MED ORDER — LIDOCAINE HCL URETHRAL/MUCOSAL 2 % EX GEL
1.0000 "application " | Freq: Once | CUTANEOUS | Status: AC
  Administered 2018-04-13: 1 via TOPICAL
  Filled 2018-04-13: qty 5

## 2018-04-13 MED ORDER — LEVOFLOXACIN 500 MG PO TABS: 500.0000 mg | ORAL_TABLET | Freq: Every day | ORAL | 0 refills | Status: AC

## 2018-04-13 NOTE — MAU Note (Signed)
Pt reports her stitches in her vaginal area are very painful and she thinks they are infected.s/p vag delivery 01/13

## 2018-04-13 NOTE — MAU Provider Note (Signed)
Chief Complaint: Wound Check   First Provider Initiated Contact with Patient 04/13/18 1402     SUBJECTIVE HPI: Valerie LusterMadhu Beighley is a 23 y.o. 581P1001 female who presents to Maternity Admissions reporting vaginal pain. This is her 2nd MAU visit since her vaginal delivery on 1/13. She had a first degree perineal repair. Was treated with augmentin for endometritis during her last MAU visit.  Reports vaginal pain worsening over the last 3 days. Completed her abx. Has been using tylenol & dermoplast without relief. Denies abdominal pain or fever. Has not had intercourse since delivery. Has not seen her ob ([facility]) since delivery.   Location: vagina Quality: burning Severity: 8/10 on pain scale Duration: 3 days Timing: constant Modifying factors: worse with sitting and touching Associated signs and symptoms: none  Past Medical History:  Diagnosis Date  . Medical history non-contributory    OB History  Gravida Para Term Preterm AB Living  1 1 1     1   SAB TAB Ectopic Multiple Live Births        0 1    # Outcome Date GA Lbr Len/2nd Weight Sex Delivery Anes PTL Lv  1 Term 04/02/18 2103w0d 17:25 / 04:18 3731 g M Vag-Spont EPI  LIV     Birth Comments: WNL   Past Surgical History:  Procedure Laterality Date  . NO PAST SURGERIES     Social History   Socioeconomic History  . Marital status: Married    Spouse name: Not on file  . Number of children: Not on file  . Years of education: Not on file  . Highest education level: Not on file  Occupational History  . Not on file  Social Needs  . Financial resource strain: Not on file  . Food insecurity:    Worry: Not on file    Inability: Not on file  . Transportation needs:    Medical: Not on file    Non-medical: Not on file  Tobacco Use  . Smoking status: Never Smoker  . Smokeless tobacco: Never Used  Substance and Sexual Activity  . Alcohol use: Never    Frequency: Never  . Drug use: Never  . Sexual activity: Yes  Lifestyle  . Physical  activity:    Days per week: Not on file    Minutes per session: Not on file  . Stress: Not on file  Relationships  . Social connections:    Talks on phone: Not on file    Gets together: Not on file    Attends religious service: Not on file    Active member of club or organization: Not on file    Attends meetings of clubs or organizations: Not on file    Relationship status: Not on file  . Intimate partner violence:    Fear of current or ex partner: Not on file    Emotionally abused: Not on file    Physically abused: Not on file    Forced sexual activity: Not on file  Other Topics Concern  . Not on file  Social History Narrative  . Not on file   Family History  Problem Relation Age of Onset  . ADD / ADHD Neg Hx   . Alcohol abuse Neg Hx   . Arthritis Neg Hx   . Anxiety disorder Neg Hx   . Asthma Neg Hx   . Birth defects Neg Hx   . Cancer Neg Hx   . COPD Neg Hx   . Depression Neg Hx   .  Diabetes Neg Hx   . Drug abuse Neg Hx   . Early death Neg Hx   . Hearing loss Neg Hx   . Heart disease Neg Hx   . Hyperlipidemia Neg Hx   . Hypertension Neg Hx   . Intellectual disability Neg Hx   . Learning disabilities Neg Hx   . Kidney disease Neg Hx   . Miscarriages / Stillbirths Neg Hx   . Obesity Neg Hx   . Stroke Neg Hx   . Vision loss Neg Hx   . Varicose Veins Neg Hx    No current facility-administered medications on file prior to encounter.    Current Outpatient Medications on File Prior to Encounter  Medication Sig Dispense Refill  . diclofenac (VOLTAREN) 75 MG EC tablet Take 1 tablet (75 mg total) by mouth 2 (two) times daily. 30 tablet 0  . Prenatal Vit-Fe Fumarate-FA (PRENATAL MULTIVITAMIN) TABS tablet Take 1 tablet by mouth daily at 12 noon.     No Known Allergies  I have reviewed patient's Past Medical Hx, Surgical Hx, Family Hx, Social Hx, medications and allergies.   Review of Systems  Constitutional: Negative.   Gastrointestinal: Negative.   Genitourinary:  Positive for vaginal pain. Negative for dysuria, vaginal bleeding and vaginal discharge.    OBJECTIVE Patient Vitals for the past 24 hrs:  BP Temp Temp src Pulse Resp SpO2 Height Weight  04/13/18 1443 102/63 97.8 F (36.6 C) Oral 68 18 99 % - -  04/13/18 1314 109/60 98.1 F (36.7 C) Oral 75 16 98 % 5\' 4"  (1.626 m) 71.7 kg   Constitutional: Well-developed, well-nourished female in no acute distress.  Cardiovascular: normal rate & rhythm, no murmur Respiratory: normal rate and effort. Lung sounds clear throughout GI: Abd soft, non-tender, Pos BS x 4. No guarding or rebound tenderness MS: Extremities nontender, no edema, normal ROM Neurologic: Alert and oriented x 4.  GU: 1st degree repair slightly separated with some exudate & erythema. Singular ulcerative lesion just inferior to urethra.   LAB RESULTS No results found for this or any previous visit (from the past 24 hour(s)).  IMAGING No results found.  MAU COURSE Orders Placed This Encounter  Procedures  . Herpes simplex virus (HSV), DNA by PCR Sterile Swab  . Discharge patient   Meds ordered this encounter  Medications  . lidocaine (XYLOCAINE) 2 % jelly 1 application  . levofloxacin (LEVAQUIN) 500 MG tablet    Sig: Take 1 tablet (500 mg total) by mouth daily for 5 days.    Dispense:  5 tablet    Refill:  0    Order Specific Question:   Supervising Provider    Answer:   ERVIN, MICHAEL L [[id number]]  . valACYclovir (VALTREX) 1000 MG tablet    Sig: Take 1 tablet (1,000 mg total) by mouth 2 (two) times daily for 10 days.    Dispense:  20 tablet    Refill:  0    Order Specific Question:   Supervising Provider    Answer:   Hermina Staggers [[id number]]    MDM Exam performed by myself & Dr. Shawnie Pons, as she evaluated her during her last visit. 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  Rx Levaquin x 5 days.   [facility]) notified of patient and need for f/u next week.   ASSESSMENT 1. Infection of perineal wound  following delivery   2. Vulvar ulcer     PLAN Discharge home in stable  condition.  Follow-up Information    [facility] Follow [facility].   Specialty:  Obstetrics and Gynecology Why:  office will call you to schedule follow up appointment Contact information: [address] [place] [phone]         Allergies as of 04/13/2018   No Known Allergies     Medication List    STOP taking these medications   amoxicillin-clavulanate 875-125 MG tablet Commonly known as:  AUGMENTIN     TAKE these medications   diclofenac 75 MG EC tablet Commonly known as:  VOLTAREN Take 1 tablet (75 mg total) by mouth 2 (two) times daily.   levofloxacin 500 MG tablet Commonly known as:  LEVAQUIN Take 1 tablet (500 mg total) by mouth daily for 5 days.   prenatal multivitamin Tabs tablet Take 1 tablet by mouth daily at 12 noon.   valACYclovir 1000 MG tablet Commonly known as:  VALTREX Take 1 tablet (1,000 mg total) by mouth 2 (two) times daily for 10 days.        Judeth HornLawrence, Casyn Becvar, NP 04/13/2018  5:11 PM

## 2018-04-13 NOTE — Discharge Instructions (Signed)
Care of a Perineal Tear  A perineal tear is a cut or tear (laceration) in the tissue between the opening of the vagina and the anus (perineum). Some women develop a perineal tear during a vaginal birth. This can happen as the baby emerges from the birth canal and the perineum is stretched. There are four degrees of perineal tears based on how deep and long the laceration is:  · First degree. This involves a shallow tear at the edge of the vaginal opening that extends slightly into the perineal skin.  · Second degree. This involves tearing described in first degree perineal tear, and an additional deeper tear of the vaginal opening and perineal tissues. It may also include tearing of a muscle just under the perineal skin.  · Third degree. This involves tearing described in first and second degree perineal tears, with the addition that tearing in the third degree extends into the muscle of the anus (anal sphincter).  · Fourth degree. This involves all levels of tears described in first, second, and third degree perineal tears, with the tear in the fourth degree extending into the rectum.  First and second degree perineal tears may or may not be stitched closed, depending on their location and appearance. Third and fourth degree perineal tears are stitched closed immediately after the baby’s birth.  What are the risks?  Depending on the type of perineal tear you have, you may be at risk for:  · Bleeding.  · Developing a collection of blood in the perineal tear area (hematoma).  · Pain. This may include pain when you urinate, or pain when you have a bowel movement.  · Infection at the site of the tear.  · Fever.  · Trouble controlling your urination or bowels (incontinence).  · Painful sex.  How to care for a perineal tear  Wound care  · Take a sitz bath as told by your health care provider. A sitz bath is a warm water bath that is taken while you are sitting down. The water should only come up to your hips and should  cover your buttocks. This can speed up healing.  ? Partially fill a bathtub with warm water. You will only need the water to be deep enough to cover your hips and buttocks when you are sitting in it.  ? If your health care provider told you to put medicine in the water, follow the directions exactly as told.  ? Sit in the water and open the tub drain a little.  ? Turn on the warm water again to keep the tub at the correct level. Keep the water running constantly.  ? Soak in the water for 15-20 minutes or as told by your health care provider.  ? After the sitz bath, pat the affected area dry first. Do not rub it.  ? Be careful when you stand up after the sitz bath because you may feel dizzy.  · Wash your hands before and after applying medicine to the area.  · Wear a sanitary pad as told by your health care provider. Change the pad as often as told by your health care provider.  · Leave stitches (sutures), skin glue, or adhesive strips in place. These skin closures may need to stay in place for 2 weeks or longer. If adhesive strip edges start to loosen and curl up, you may trim the loose edges. Do not remove adhesive strips completely unless your health care provider tells you to do that.  ·   Check your wound every day for signs of infection. Check for:  ? Redness, swelling, or pain.  ? Fluid or blood.  ? Warmth.  ? Pus or a bad smell.  Managing pain  · If directed, put ice on the painful area:  ? Put ice in a plastic bag.  ? Place a towel between your skin and the bag.  ? Leave the ice on for 20 minutes, 2-3 times a day.  · Apply a numbing spray to the perineal tear site as told by your health care provider. This may help with discomfort.  · Take and apply over-the-counter and prescription medicines only as told by your health care provider.  · If told, put about 3 witch hazel-containing hemorrhoid treatment pads on top of your sanitary pad. The witch hazel in the hemorrhoid pads helps with swelling and  discomfort.  · Sit on an inflatable ring or pillow. This may provide comfort.  General instructions  · Squeeze warm water on your perineum after urinating. This should be done from front to back with a squeeze bottle. Pat the area to dry it.  · Do not have sex, use tampons, or place anything in your vagina for at least 6 weeks or as told by your health care provider.  · Keep all follow-up visits as told by your health care provider. These include any postpartum visits. This is important.  Contact a health care provider if:  · Your pain is not relieved with medicines.  · You have painful urination.  · You have redness, swelling, or pain around your tear.  · You have fluid or blood coming from your tear.  · Your tear feels warm to the touch.  · You have pus or a bad smell coming from your tear.  · You have a fever.  Get help right away if:  · Your tear opens.  · You cannot urinate.  · You have an increase in bleeding.  · You have severe pain.  Summary  · A perineal tear is a cut or tear (laceration) in the tissue between the opening of the vagina and the anus (perineum).  · There are four degrees of perineal tears based on how deep and long the laceration is.  · First and second-degree perineal tears may or may not be stitched closed, depending on their location and appearance. Third and fourth- degree perineal tears are stitched closed immediately after the baby’s birth.  · Follow your health care provider's instructions for caring for your perineal tear. Know how to manage pain and how to care for your wound. Know when to call your health care provider and when to seek immediate emergency care.  This information is not intended to replace advice given to you by your health care provider. Make sure you discuss any questions you have with your health care provider.  Document Released: 07/22/2013 Document Revised: 04/11/2016 Document Reviewed: 04/11/2016  Elsevier Interactive Patient Education © 2019 [employer]

## 2018-04-15 LAB — HSV DNA BY PCR (REFERENCE LAB): HSV 2 DNA: NEGATIVE

## 2018-04-15 LAB — HERPES SIMPLEX VIRUS(HSV) DNA BY PCR: HSV 1 DNA: NEGATIVE

## 2018-08-24 ENCOUNTER — Emergency Department (HOSPITAL_COMMUNITY)
Admission: EM | Admit: 2018-08-24 | Discharge: 2018-08-24 | Disposition: A | Discharge status: Home / Self Care | Payer: Medicaid Other | Attending: Emergency Medicine | Admitting: Emergency Medicine

## 2018-08-24 ENCOUNTER — Emergency Department (HOSPITAL_COMMUNITY): Payer: Medicaid Other

## 2018-08-24 ENCOUNTER — Other Ambulatory Visit: Payer: Self-pay

## 2018-08-24 DIAGNOSIS — R0602 Shortness of breath: Secondary | ICD-10-CM

## 2018-08-24 DIAGNOSIS — R0789 Other chest pain: Secondary | ICD-10-CM

## 2018-08-24 LAB — BASIC METABOLIC PANEL
Anion gap: 6 (ref 5–15)
BUN: 5 mg/dL — ABNORMAL LOW (ref 6–20)
CO2: 25 mmol/L (ref 22–32)
Calcium: 9.2 mg/dL (ref 8.9–10.3)
Chloride: 106 mmol/L (ref 98–111)
Creatinine, Ser: 0.68 mg/dL (ref 0.44–1.00)
GFR calc Af Amer: >60 mL/min (ref >60)
GFR calc non Af Amer: >60 mL/min (ref >60)
Glucose, Bld: 90 mg/dL (ref 70–99)
Potassium: 4 mmol/L (ref 3.5–5.1)
Sodium: 137 mmol/L (ref 135–145)

## 2018-08-24 LAB — CBC
HCT: 41 % (ref 36.0–46.0)
Hemoglobin: 13.1 g/dL (ref 12.0–15.0)
MCH: 28.2 pg (ref 26.0–34.0)
MCHC: 32 g/dL (ref 30.0–36.0)
MCV: 88.4 fL (ref 80.0–100.0)
Platelets: 200 10*3/uL (ref 150–400)
RBC: 4.64 MIL/uL (ref 3.87–5.11)
RDW: 13.2 % (ref 11.5–15.5)
WBC: 7.9 10*3/uL (ref 4.0–10.5)
nRBC: 0 % (ref 0.0–0.2)

## 2018-08-24 LAB — I-STAT BETA HCG BLOOD, ED (MC, WL, AP ONLY): I-stat hCG, quantitative: <5 m[IU]/mL (ref <5)

## 2018-08-24 MED ORDER — IOHEXOL 350 MG/ML SOLN
90.0000 mL | Freq: Once | INTRAVENOUS | Status: AC | PRN
  Administered 2018-08-24: 90 mL via INTRAVENOUS

## 2018-08-24 NOTE — ED Provider Notes (Signed)
[facility] EMERGENCY DEPARTMENT Provider Note   CSN: [id number] Arrival date & time: 08/24/18  1155    History   Chief Complaint Chief Complaint  Patient presents with  . possible PE    HPI Valerie Ward is a 23 y.o. female.     Valerie Ward is a 23 y.o. female with history of recent pregnancy, presents to the emergency department for evaluation of chest pain and shortness of breath.  She was seen by a cardiologist with [facility] initially and had labs drawn, her d-dimer came back elevated and she was sent to the ED for PE scan to rule out this as cause for shortness of breath.  She reports that she has had short episodes of shortness of breath with some intermittent chest pain described as a tightness over the past few months her most recent episode was yesterday and lasted about an hour.  She denies any swelling or pain.  No recent long distance travel.  Gave birth at the end of January.  No prior history of PE or DVT.  No prior history of other heart issues or ACS, cardiologist did not feel this was the cause of her chest pain.     Past Medical History:  Diagnosis Date  . Medical history non-contributory     Patient Active Problem List   Diagnosis Date Noted  . Postpartum normal course 04/04/2018  . Normal delivery 04/02/2018  . First degree laceration of perineum during delivery, postpartum 04/02/2018  . Supervision of normal first pregnancy 04/01/2018    Past Surgical History:  Procedure Laterality Date  . NO PAST SURGERIES       OB History    Gravida  1   Para  1   Term  1   Preterm      AB      Living  1     SAB      TAB      Ectopic      Multiple  0   Live Births  1            Home Medications    Prior to Admission medications   Medication Sig Start Date End Date Taking? Authorizing Provider  diclofenac (VOLTAREN) 75 MG EC tablet Take 1 tablet (75 mg total) by mouth 2 (two) times daily. 04/05/18   Reva BoresPratt, Tanya S, MD   Prenatal Vit-Fe Fumarate-FA (PRENATAL MULTIVITAMIN) TABS tablet Take 1 tablet by mouth daily at 12 noon.    [provider]    Family History Family History  Problem Relation Age of Onset  . ADD / ADHD Neg Hx   . Alcohol abuse Neg Hx   . Arthritis Neg Hx   . Anxiety disorder Neg Hx   . Asthma Neg Hx   . Birth defects Neg Hx   . Cancer Neg Hx   . COPD Neg Hx   . Depression Neg Hx   . Diabetes Neg Hx   . Drug abuse Neg Hx   . Early death Neg Hx   . Hearing loss Neg Hx   . Heart disease Neg Hx   . Hyperlipidemia Neg Hx   . Hypertension Neg Hx   . Intellectual disability Neg Hx   . Learning disabilities Neg Hx   . Kidney disease Neg Hx   . Miscarriages / Stillbirths Neg Hx   . Obesity Neg Hx   . Stroke Neg Hx   . Vision loss Neg Hx   .  Varicose Veins Neg Hx     Social History Social History   Tobacco Use  . Smoking status: Never Smoker  . Smokeless tobacco: Never Used  Substance Use Topics  . Alcohol use: Never    Frequency: Never  . Drug use: Never     Allergies   Patient has no known allergies.   Review of Systems Review of Systems  Constitutional: Negative for chills and fever.  HENT: Negative.   Eyes: Negative for visual disturbance.  Respiratory: Positive for shortness of breath. Negative for cough.   Cardiovascular: Positive for chest pain.  Gastrointestinal: Negative for abdominal pain, nausea and vomiting.  Neurological: Negative for syncope and light-headedness.  All other systems reviewed and are negative.    Physical Exam Updated Vital Signs There were no vitals taken for this visit.  Physical Exam Vitals signs and nursing note reviewed.  Constitutional:      General: She is not in acute distress.    Appearance: She is well-developed. She is not diaphoretic.  HENT:     Head: Normocephalic and atraumatic.  Eyes:     General:        Right eye: No discharge.        Left eye: No discharge.     Pupils: Pupils are equal, round,  and reactive to light.  Neck:     Musculoskeletal: Neck supple.  Cardiovascular:     Rate and Rhythm: Normal rate and regular rhythm.     Pulses: Normal pulses.     Heart sounds: Normal heart sounds. No murmur. No friction rub. No gallop.   Pulmonary:     Effort: Pulmonary effort is normal. No respiratory distress.     Breath sounds: Normal breath sounds. No wheezing or rales.     Comments: Respirations equal and unlabored, patient able to speak in full sentences, lungs clear to auscultation bilaterally Abdominal:     General: Bowel sounds are normal. There is no distension.     Palpations: Abdomen is soft. There is no mass.     Tenderness: There is no abdominal tenderness. There is no guarding.     Comments: Abdomen soft, nondistended, nontender to palpation in all quadrants without guarding or peritoneal signs  Musculoskeletal:        General: No deformity.  Skin:    General: Skin is warm and dry.     Capillary Refill: Capillary refill takes less than 2 seconds.  Neurological:     Mental Status: She is alert.     Coordination: Coordination normal.     Comments: Speech is clear, able to follow commands Moves extremities without ataxia, coordination intact  Psychiatric:        Mood and Affect: Mood normal.        Behavior: Behavior normal.      ED Treatments / Results  Labs (all labs ordered are listed, but only abnormal results are displayed) Labs Reviewed  BASIC METABOLIC PANEL - Abnormal; Notable for the following components:      Result Value   BUN 5 (*)    All other components within normal limits  CBC  I-STAT BETA HCG BLOOD, ED ([facility], [facility], [facility] ONLY)    EKG None  Radiology Ct Angio Chest Pe W And/or Wo Contrast  Result Date: 08/24/2018 CLINICAL DATA:  LEFT upper chest pain, elevated D-dimer, postpartum in January, question pulmonary embolism, intermediate clinical probability EXAM: CT ANGIOGRAPHY CHEST WITH CONTRAST TECHNIQUE: Multidetector CT imaging of the chest  was performed using  the standard protocol during bolus administration of intravenous contrast. Multiplanar CT image reconstructions and MIPs were obtained to evaluate the vascular anatomy. CONTRAST:  67mL OMNIPAQUE IOHEXOL 350 MG/ML SOLN IV COMPARISON:  None FINDINGS: Cardiovascular: Aorta normal caliber without aneurysm or dissection. No pericardial effusion. Pulmonary arteries well opacified and patent. No evidence of pulmonary embolism. Mediastinum/Nodes: Residual thymic tissue in anterior mediastinum. Base of cervical region normal appearance. No thoracic adenopathy. Esophagus unremarkable. Lungs/Pleura: Lungs clear. No pulmonary infiltrate, pleural effusion or pneumothorax. Upper Abdomen: Visualized upper abdomen unremarkable Musculoskeletal: Osseous structures unremarkable. Review of the MIP images confirms the above findings. IMPRESSION: Normal CTA chest. No evidence of pulmonary embolism. Electronically Signed   By: Ulyses Southward M.D.   On: 08/24/2018 15:10    Procedures Procedures (including critical care time)  Medications Ordered in ED Medications  iohexol (OMNIPAQUE) 350 MG/ML injection 90 mL (90 mLs Intravenous Contrast Given 08/24/18 1450)     Initial Impression / Assessment and Plan / ED Course  I have reviewed the triage vital signs and the nursing notes.  Pertinent labs & imaging results that were available during my care of the patient were reviewed by me and considered in my medical decision making (see chart for details).  Patient sent from cardiologist for CTA to rule out PE after elevated d-dimer has been worked up for intermittent episodes of chest pain and shortness of breath with cardiologist.  Today labs are unremarkable and CTA shows no evidence of PE or other acute cause for patient's symptoms.  She is currently symptom-free.  Will discharge home with outpatient follow-up with her cardiologist.  Final Clinical Impressions(s) / ED Diagnoses   Final diagnoses:  Atypical  chest pain  SOB (shortness of breath)    ED Discharge Orders    None       Dartha Lodge, [place] 08/26/18 1553    Melene Plan, DO 08/27/18 1501

## 2018-08-24 NOTE — ED Triage Notes (Signed)
Pt sent by [facility] for CT Angio, suspected PE. Pt states she had an elevated D-dimer, recent delivery in January. Denies any sob at this time

## 2018-08-24 NOTE — ED Notes (Signed)
Patient transported to CT 

## 2018-08-24 NOTE — Discharge Instructions (Signed)
Your CT scan shows no evidence of blood clot, please continue to follow up with your primary doctor are cardiologist.

## 2018-08-24 NOTE — ED Notes (Signed)
Pt's brother in law [place] left number - [phone]

## 2018-08-24 NOTE — ED Notes (Signed)
Pt has her cell phone and is updating her family.

## 2018-11-06 ENCOUNTER — Encounter (HOSPITAL_COMMUNITY): Payer: Self-pay | Admitting: *Deleted

## 2018-11-06 ENCOUNTER — Emergency Department (HOSPITAL_COMMUNITY)
Admission: EM | Admit: 2018-11-06 | Discharge: 2018-11-06 | Disposition: A | Discharge status: Home / Self Care | Payer: Medicaid Other | Attending: Emergency Medicine | Admitting: Emergency Medicine

## 2018-11-06 ENCOUNTER — Other Ambulatory Visit: Payer: Self-pay

## 2018-11-06 DIAGNOSIS — K2900 Acute gastritis without bleeding: Secondary | ICD-10-CM | POA: Diagnosis not present

## 2018-11-06 DIAGNOSIS — R1010 Upper abdominal pain, unspecified: Secondary | ICD-10-CM | POA: Diagnosis present

## 2018-11-06 LAB — CBC
HCT: 42.7 % (ref 36.0–46.0)
Hemoglobin: 13.5 g/dL (ref 12.0–15.0)
MCH: 28.1 pg (ref 26.0–34.0)
MCHC: 31.6 g/dL (ref 30.0–36.0)
MCV: 88.8 fL (ref 80.0–100.0)
Platelets: 181 10*3/uL (ref 150–400)
RBC: 4.81 MIL/uL (ref 3.87–5.11)
RDW: 13 % (ref 11.5–15.5)
WBC: 9.9 10*3/uL (ref 4.0–10.5)
nRBC: 0 % (ref 0.0–0.2)

## 2018-11-06 LAB — URINALYSIS, ROUTINE W REFLEX MICROSCOPIC
Bilirubin Urine: NEGATIVE
Glucose, UA: NEGATIVE mg/dL
Hgb urine dipstick: NEGATIVE
Ketones, ur: NEGATIVE mg/dL
Leukocytes,Ua: NEGATIVE
Nitrite: NEGATIVE
Protein, ur: NEGATIVE mg/dL
Specific Gravity, Urine: 1.008 (ref 1.005–1.030)
pH: 6 (ref 5.0–8.0)

## 2018-11-06 LAB — I-STAT BETA HCG BLOOD, ED (MC, WL, AP ONLY): I-stat hCG, quantitative: <5 m[IU]/mL (ref <5)

## 2018-11-06 LAB — LIPASE, BLOOD: Lipase: 27 U/L (ref 11–51)

## 2018-11-06 LAB — COMPREHENSIVE METABOLIC PANEL
ALT: 15 U/L (ref 0–44)
AST: 16 U/L (ref 15–41)
Albumin: 3.8 g/dL (ref 3.5–5.0)
Alkaline Phosphatase: 120 U/L (ref 38–126)
Anion gap: 9 (ref 5–15)
BUN: 11 mg/dL (ref 6–20)
CO2: 22 mmol/L (ref 22–32)
Calcium: 9.1 mg/dL (ref 8.9–10.3)
Chloride: 106 mmol/L (ref 98–111)
Creatinine, Ser: 0.67 mg/dL (ref 0.44–1.00)
GFR calc Af Amer: >60 mL/min (ref >60)
GFR calc non Af Amer: >60 mL/min (ref >60)
Glucose, Bld: 92 mg/dL (ref 70–99)
Potassium: 3.6 mmol/L (ref 3.5–5.1)
Sodium: 137 mmol/L (ref 135–145)
Total Bilirubin: 0.8 mg/dL (ref 0.3–1.2)
Total Protein: 7.3 g/dL (ref 6.5–8.1)

## 2018-11-06 MED ORDER — SODIUM CHLORIDE 0.9% FLUSH: 3.0000 mL | Freq: Once | INTRAVENOUS | Status: DC

## 2018-11-06 MED ORDER — FAMOTIDINE 20 MG PO TABS: 20.0000 mg | ORAL_TABLET | Freq: Two times a day (BID) | ORAL | 0 refills | Status: DC

## 2018-11-06 MED ORDER — OMEPRAZOLE 20 MG PO CPDR: 20.0000 mg | DELAYED_RELEASE_CAPSULE | Freq: Every day | ORAL | 0 refills | Status: DC

## 2018-11-06 NOTE — Discharge Instructions (Addendum)
Please read the attached information.  Please discontinue using meloxicam until your symptoms resolve.  Please use Pepcid for the next 3 to 4 days if her symptoms or not improving please discontinue using the Pepcid and begin taking the omeprazole.  Please follow-up with your primary care if symptoms persist.  Return if you develop any new or worsening signs or symptoms.

## 2018-11-06 NOTE — ED Notes (Signed)
Patient verbalizes understanding of discharge instructions. Opportunity for questioning and answers were provided. Armband removed by staff, pt discharged from ED.  

## 2018-11-06 NOTE — ED Notes (Signed)
ED Provider at bedside. 

## 2018-11-06 NOTE — ED Triage Notes (Signed)
Pt reports generalized abd pain since 2pm, woke up this morning with NV

## 2018-11-06 NOTE — ED Provider Notes (Signed)
[facility]   CSN: [id number] Arrival date & time: 11/06/18  0553    History   Chief Complaint Chief Complaint  Patient presents with  . Abdominal Pain    HPI Valerie Ward is a 23 y.o. female.     HPI   23 year old female presents today with complaints of indigestion.  Patient is Nepali speaking but is adamant that she does not want to use an interpreter even though we have the video system in the room.  She notes that over the last week she has had pain and cramping sensation in her upper abdomen with burning up into her throat.  She notes this is worse on an empty stomach.  She notes she has had vomiting but not after eating.  She denies any lower abdominal pain or fever.  She reports normal bowel movement.  She notes she has been having pain in her joints and was recently started on meloxicam and wonders if this is causing her symptoms.  She does not take any other anti-inflammatories or drink alcohol.  She has no pain presently.  She is not pregnant or breast-feeding.  Past Medical History:  Diagnosis Date  . Medical history non-contributory     Patient Active Problem List   Diagnosis Date Noted  . Postpartum normal course 04/04/2018  . Normal delivery 04/02/2018  . First degree laceration of perineum during delivery, postpartum 04/02/2018  . Supervision of normal first pregnancy 04/01/2018    Past Surgical History:  Procedure Laterality Date  . NO PAST SURGERIES       OB History    Gravida  1   Para  1   Term  1   Preterm      AB      Living  1     SAB      TAB      Ectopic      Multiple  0   Live Births  1            Home Medications    Prior to Admission medications   Medication Sig Start Date End Date Taking? Authorizing Provider  diclofenac (VOLTAREN) 75 MG EC tablet Take 1 tablet (75 mg total) by mouth 2 (two) times daily. 04/05/18   Reva BoresPratt, Tanya S, MD  famotidine (PEPCID) 20 MG  tablet Take 1 tablet (20 mg total) by mouth 2 (two) times daily. 11/06/18   Keven Osborn, Tinnie GensJeffrey, PA-C  omeprazole (PRILOSEC) 20 MG capsule Take 1 capsule (20 mg total) by mouth daily. 11/06/18   Alick Lecomte, Tinnie GensJeffrey, PA-C  Prenatal Vit-Fe Fumarate-FA (PRENATAL MULTIVITAMIN) TABS tablet Take 1 tablet by mouth daily at 12 noon.    [provider]    Family History Family History  Problem Relation Age of Onset  . ADD / ADHD Neg Hx   . Alcohol abuse Neg Hx   . Arthritis Neg Hx   . Anxiety disorder Neg Hx   . Asthma Neg Hx   . Birth defects Neg Hx   . Cancer Neg Hx   . COPD Neg Hx   . Depression Neg Hx   . Diabetes Neg Hx   . Drug abuse Neg Hx   . Early death Neg Hx   . Hearing loss Neg Hx   . Heart disease Neg Hx   . Hyperlipidemia Neg Hx   . Hypertension Neg Hx   . Intellectual disability Neg Hx   . Learning disabilities Neg Hx   .  Kidney disease Neg Hx   . Miscarriages / Stillbirths Neg Hx   . Obesity Neg Hx   . Stroke Neg Hx   . Vision loss Neg Hx   . Varicose Veins Neg Hx     Social History Social History   Tobacco Use  . Smoking status: Never Smoker  . Smokeless tobacco: Never Used  Substance Use Topics  . Alcohol use: Never    Frequency: Never  . Drug use: Never     Allergies   Patient has no known allergies.   Review of Systems Review of Systems  All other systems reviewed and are negative.    Physical Exam Updated Vital Signs BP 108/72 (BP Location: Left Arm)   Pulse (!) 56   Temp 98 F (36.7 C) (Oral)   Resp 20   LMP 10/24/2018   SpO2 100%   Physical Exam Vitals signs and nursing note reviewed.  Constitutional:      Appearance: She is well-developed.  HENT:     Head: Normocephalic and atraumatic.  Eyes:     General: No scleral icterus.       Right eye: No discharge.        Left eye: No discharge.     Conjunctiva/sclera: Conjunctivae normal.     Pupils: Pupils are equal, round, and reactive to light.  Neck:     Musculoskeletal: Normal  range of motion.     Vascular: No JVD.     Trachea: No tracheal deviation.  Pulmonary:     Effort: Pulmonary effort is normal.     Breath sounds: No stridor.  Abdominal:     General: There is no distension.     Palpations: Abdomen is soft.     Tenderness: There is no abdominal tenderness. There is no guarding.  Neurological:     Mental Status: She is alert and oriented to person, place, and time.     Coordination: Coordination normal.  Psychiatric:        Behavior: Behavior normal.        Thought Content: Thought content normal.        Judgment: Judgment normal.    ED Treatments / Results  Labs (all labs ordered are listed, but only abnormal results are displayed) Labs Reviewed  URINALYSIS, ROUTINE W REFLEX MICROSCOPIC - Abnormal; Notable for the following components:      Result Value   Color, Urine STRAW (*)    All other components within normal limits  LIPASE, BLOOD  COMPREHENSIVE METABOLIC PANEL  CBC  I-STAT BETA HCG BLOOD, ED ([facility], [facility], [facility] ONLY)    EKG None  Radiology No results found.  Procedures Procedures (including critical care time)  Medications Ordered in ED Medications  sodium chloride flush (NS) 0.9 % injection 3 mL (has no administration in time range)     Initial Impression / Assessment and Plan / ED Course  I have reviewed the triage vital signs and the nursing notes.  Pertinent labs & imaging results that were available during my care of the patient were reviewed by me and considered in my medical decision making (see chart for details).        Assessment/Plan: 23 year old female presents today with likely gastritis.  She is well-appearing in no acute distress now.  She has soft nontender abdomen reassuring laboratory analysis.  I discussed the need for Pepcid versus omeprazole.  She will use Pepcid as needed if this does not improve her symptoms she will discontinue using this and  start using omeprazole.  She is urged to follow-up as an  outpatient with her primary care if symptoms persist.  Return precautions given.  She verbalized understanding and agreement to today's plan.   Final Clinical Impressions(s) / ED Diagnoses   Final diagnoses:  Acute gastritis without hemorrhage, unspecified gastritis type    ED Discharge Orders         Ordered    famotidine (PEPCID) 20 MG tablet  2 times daily     11/06/18 1117    omeprazole (PRILOSEC) 20 MG capsule  Daily     11/06/18 1117           Okey Regal, PA-C 11/06/18 1118    Carmin Muskrat, MD 11/08/18 [id number]

## 2018-11-15 ENCOUNTER — Emergency Department (HOSPITAL_COMMUNITY): Payer: Medicaid Other

## 2018-11-15 ENCOUNTER — Other Ambulatory Visit: Payer: Self-pay

## 2018-11-15 ENCOUNTER — Emergency Department (HOSPITAL_COMMUNITY)
Admission: EM | Admit: 2018-11-15 | Discharge: 2018-11-15 | Disposition: A | Discharge status: Home / Self Care | Payer: Medicaid Other | Attending: Emergency Medicine | Admitting: Emergency Medicine

## 2018-11-15 ENCOUNTER — Encounter (HOSPITAL_COMMUNITY): Payer: Self-pay | Admitting: Emergency Medicine

## 2018-11-15 DIAGNOSIS — R1084 Generalized abdominal pain: Secondary | ICD-10-CM | POA: Diagnosis present

## 2018-11-15 DIAGNOSIS — R112 Nausea with vomiting, unspecified: Secondary | ICD-10-CM | POA: Diagnosis not present

## 2018-11-15 DIAGNOSIS — R197 Diarrhea, unspecified: Secondary | ICD-10-CM

## 2018-11-15 LAB — CBC WITH DIFFERENTIAL/PLATELET
Abs Immature Granulocytes: 0.18 10*3/uL — ABNORMAL HIGH (ref 0.00–0.07)
Basophils Absolute: 0.1 10*3/uL (ref 0.0–0.1)
Basophils Relative: 0 %
Eosinophils Absolute: 0.2 10*3/uL (ref 0.0–0.5)
Eosinophils Relative: 2 %
HCT: 42.7 % (ref 36.0–46.0)
Hemoglobin: 14 g/dL (ref 12.0–15.0)
Immature Granulocytes: 2 %
Lymphocytes Relative: 32 %
Lymphs Abs: 3.7 10*3/uL (ref 0.7–4.0)
MCH: 28.5 pg (ref 26.0–34.0)
MCHC: 32.8 g/dL (ref 30.0–36.0)
MCV: 87 fL (ref 80.0–100.0)
Monocytes Absolute: 0.8 10*3/uL (ref 0.1–1.0)
Monocytes Relative: 7 %
Neutro Abs: 6.8 10*3/uL (ref 1.7–7.7)
Neutrophils Relative %: 57 %
Platelets: 166 10*3/uL (ref 150–400)
RBC: 4.91 MIL/uL (ref 3.87–5.11)
RDW: 13 % (ref 11.5–15.5)
WBC: 11.7 10*3/uL — ABNORMAL HIGH (ref 4.0–10.5)
nRBC: 0 % (ref 0.0–0.2)

## 2018-11-15 LAB — URINALYSIS, ROUTINE W REFLEX MICROSCOPIC
Bilirubin Urine: NEGATIVE
Glucose, UA: NEGATIVE mg/dL
Hgb urine dipstick: NEGATIVE
Ketones, ur: 5 mg/dL — AB
Leukocytes,Ua: NEGATIVE
Nitrite: NEGATIVE
Protein, ur: NEGATIVE mg/dL
Specific Gravity, Urine: 1.014 (ref 1.005–1.030)
pH: 7 (ref 5.0–8.0)

## 2018-11-15 LAB — COMPREHENSIVE METABOLIC PANEL
ALT: 14 U/L (ref 0–44)
AST: 27 U/L (ref 15–41)
Albumin: 3.9 g/dL (ref 3.5–5.0)
Alkaline Phosphatase: 129 U/L — ABNORMAL HIGH (ref 38–126)
Anion gap: 11 (ref 5–15)
BUN: 12 mg/dL (ref 6–20)
CO2: 19 mmol/L — ABNORMAL LOW (ref 22–32)
Calcium: 9.1 mg/dL (ref 8.9–10.3)
Chloride: 105 mmol/L (ref 98–111)
Creatinine, Ser: 0.71 mg/dL (ref 0.44–1.00)
GFR calc Af Amer: >60 mL/min (ref >60)
GFR calc non Af Amer: >60 mL/min (ref >60)
Glucose, Bld: 114 mg/dL — ABNORMAL HIGH (ref 70–99)
Potassium: 4.3 mmol/L (ref 3.5–5.1)
Sodium: 135 mmol/L (ref 135–145)
Total Bilirubin: 1.9 mg/dL — ABNORMAL HIGH (ref 0.3–1.2)
Total Protein: 7.5 g/dL (ref 6.5–8.1)

## 2018-11-15 LAB — LIPASE, BLOOD: Lipase: 24 U/L (ref 11–51)

## 2018-11-15 LAB — I-STAT BETA HCG BLOOD, ED (MC, WL, AP ONLY): I-stat hCG, quantitative: <5 m[IU]/mL (ref <5)

## 2018-11-15 MED ORDER — SODIUM CHLORIDE 0.9 % IV BOLUS
1000.0000 mL | Freq: Once | INTRAVENOUS | Status: AC
  Administered 2018-11-15: 1000 mL via INTRAVENOUS

## 2018-11-15 MED ORDER — ALUM & MAG HYDROXIDE-SIMETH 200-200-20 MG/5ML PO SUSP
30.0000 mL | Freq: Once | ORAL | Status: AC
  Administered 2018-11-15: 30 mL via ORAL
  Filled 2018-11-15: qty 30

## 2018-11-15 MED ORDER — ONDANSETRON HCL 4 MG/2ML IJ SOLN
4.0000 mg | Freq: Once | INTRAMUSCULAR | Status: AC
  Administered 2018-11-15: 4 mg via INTRAVENOUS
  Filled 2018-11-15: qty 2

## 2018-11-15 MED ORDER — MORPHINE SULFATE (PF) 2 MG/ML IV SOLN
2.0000 mg | Freq: Once | INTRAVENOUS | Status: AC
  Administered 2018-11-15: 2 mg via INTRAVENOUS
  Filled 2018-11-15: qty 1

## 2018-11-15 MED ORDER — LIDOCAINE VISCOUS HCL 2 % MT SOLN
15.0000 mL | Freq: Once | OROMUCOSAL | Status: AC
  Administered 2018-11-15: 15 mL via ORAL
  Filled 2018-11-15: qty 15

## 2018-11-15 MED ORDER — DICYCLOMINE HCL 20 MG PO TABS: 20.0000 mg | ORAL_TABLET | Freq: Two times a day (BID) | ORAL | 0 refills | Status: DC | PRN

## 2018-11-15 MED ORDER — FAMOTIDINE IN NACL 20-0.9 MG/50ML-% IV SOLN
20.0000 mg | Freq: Once | INTRAVENOUS | Status: AC
  Administered 2018-11-15: 20 mg via INTRAVENOUS
  Filled 2018-11-15: qty 50

## 2018-11-15 MED ORDER — KETOROLAC TROMETHAMINE 30 MG/ML IJ SOLN
30.0000 mg | Freq: Once | INTRAMUSCULAR | Status: AC
  Administered 2018-11-15: 30 mg via INTRAVENOUS
  Filled 2018-11-15: qty 1

## 2018-11-15 MED ORDER — ONDANSETRON 4 MG PO TBDP: 4.0000 mg | ORAL_TABLET | Freq: Three times a day (TID) | ORAL | 0 refills | Status: DC | PRN

## 2018-11-15 MED ORDER — IOHEXOL 300 MG/ML  SOLN
100.0000 mL | Freq: Once | INTRAMUSCULAR | Status: AC | PRN
  Administered 2018-11-15: 100 mL via INTRAVENOUS

## 2018-11-15 NOTE — ED Provider Notes (Signed)
[place] EMERGENCY DEPARTMENT Provider Note   CSN: [id number] Arrival date & time: 11/15/18  0715     History   Chief Complaint Chief Complaint  Patient presents with   Abdominal Pain    HPI Valerie Ward is a 23 y.o. female presents today for evaluation of acute onset, progressively worsening generalized abdominal pain beginning around midnight.  She reports the pain awoke her from her sleep.  It is "unbearable ", at times radiates to the midline of the chest.  She denies any shortness of breath.  She has had one episode of nonbloody nonbilious emesis and a few episodes of watery nonbloody diarrhea since her symptoms began.  She has had similar pain for the last few weeks and was seen for similar symptoms on 11/06/2018 but reports they were not quite this bad.  She was discharged home for reassuring physical examination and blood work with a prescription for Pepcid and Prilosec which she reports she has been taking with little relief.  Today she took Maalox and Prilosec without relief.  She denies any vaginal itching, bleeding, discharge, fevers, or urinary symptoms.  No known sick contacts, denies suspicious food intake, no recent treatment with antibiotics.  No aggravating or alleviating factors noted and symptoms do not appear to worsen with food but she does report she feels quite nauseous after drinking water.     The history is provided by the patient.    Past Medical History:  Diagnosis Date   Medical history non-contributory     Patient Active Problem List   Diagnosis Date Noted   Postpartum normal course 04/04/2018   Normal delivery 04/02/2018   First degree laceration of perineum during delivery, postpartum 04/02/2018   Supervision of normal first pregnancy 04/01/2018    Past Surgical History:  Procedure Laterality Date   NO PAST SURGERIES       OB History    Gravida  1   Para  1   Term  1   Preterm      AB      Living  1     SAB      TAB      Ectopic      Multiple  0   Live Births  1            Home Medications    Prior to Admission medications   Medication Sig Start Date End Date Taking? Authorizing Provider  famotidine (PEPCID) 20 MG tablet Take 1 tablet (20 mg total) by mouth 2 (two) times daily. 11/06/18  Yes Hedges, Dellis Filbert, PA-C  omeprazole (PRILOSEC) 20 MG capsule Take 1 capsule (20 mg total) by mouth daily. 11/06/18  Yes Hedges, Dellis Filbert, PA-C  diclofenac (VOLTAREN) 75 MG EC tablet Take 1 tablet (75 mg total) by mouth 2 (two) times daily. Patient not taking: Reported on 11/15/2018 04/05/18   Donnamae Jude, MD  dicyclomine (BENTYL) 20 MG tablet Take 1 tablet (20 mg total) by mouth 2 (two) times daily as needed for spasms. 11/15/18   Nils Flack, Angelissa Supan A, PA-C  ondansetron (ZOFRAN ODT) 4 MG disintegrating tablet Take 1 tablet (4 mg total) by mouth every 8 (eight) hours as needed for nausea or vomiting. 11/15/18   Rodell Perna A, PA-C  Prenatal Vit-Fe Fumarate-FA (PRENATAL MULTIVITAMIN) TABS tablet Take 1 tablet by mouth daily at 12 noon.    [provider]    Family History Family History  Problem Relation Age of Onset   ADD /  ADHD Neg Hx    Alcohol abuse Neg Hx    Arthritis Neg Hx    Anxiety disorder Neg Hx    Asthma Neg Hx    Birth defects Neg Hx    Cancer Neg Hx    COPD Neg Hx    Depression Neg Hx    Diabetes Neg Hx    Drug abuse Neg Hx    Early death Neg Hx    Hearing loss Neg Hx    Heart disease Neg Hx    Hyperlipidemia Neg Hx    Hypertension Neg Hx    Intellectual disability Neg Hx    Learning disabilities Neg Hx    Kidney disease Neg Hx    Miscarriages / Stillbirths Neg Hx    Obesity Neg Hx    Stroke Neg Hx    Vision loss Neg Hx    Varicose Veins Neg Hx     Social History Social History   Tobacco Use   Smoking status: Never Smoker   Smokeless tobacco: Never Used  Substance Use Topics   Alcohol use: Never    Frequency: Never   Drug use:  Never     Allergies   Patient has no known allergies.   Review of Systems Review of Systems  Constitutional: Negative for chills and fever.  Respiratory: Negative for shortness of breath.   Cardiovascular: Negative for chest pain.  Gastrointestinal: Positive for abdominal pain, diarrhea, nausea and vomiting.  Genitourinary: Negative for dysuria, frequency, hematuria, urgency, vaginal bleeding, vaginal discharge and vaginal pain.  All other systems reviewed and are negative.    Physical Exam Updated Vital Signs BP 99/72    Pulse 62    Temp 97.7 F (36.5 C) (Oral)    Resp 17    LMP 10/24/2018    SpO2 100%   Physical Exam Vitals signs and nursing note reviewed.  Constitutional:      General: She is not in acute distress.    Appearance: She is well-developed.     Comments: Appears uncomfortable  HENT:     Head: Normocephalic and atraumatic.  Eyes:     General:        Right eye: No discharge.        Left eye: No discharge.     Conjunctiva/sclera: Conjunctivae normal.  Neck:     Vascular: No JVD.     Trachea: No tracheal deviation.  Cardiovascular:     Rate and Rhythm: Normal rate and regular rhythm.  Pulmonary:     Effort: Pulmonary effort is normal.     Breath sounds: Normal breath sounds.  Abdominal:     General: Abdomen is flat. Bowel sounds are decreased. There is no distension.     Palpations: Abdomen is soft.     Tenderness: There is generalized abdominal tenderness. There is no right CVA tenderness, left CVA tenderness, guarding or rebound.  Skin:    General: Skin is warm and dry.     Findings: No erythema.  Neurological:     Mental Status: She is alert.  Psychiatric:        Behavior: Behavior normal.      ED Treatments / Results  Labs (all labs ordered are listed, but only abnormal results are displayed) Labs Reviewed  CBC WITH DIFFERENTIAL/PLATELET - Abnormal; Notable for the following components:      Result Value   WBC 11.7 (*)    Abs Immature  Granulocytes 0.18 (*)    All other components within normal  limits  COMPREHENSIVE METABOLIC PANEL - Abnormal; Notable for the following components:   CO2 19 (*)    Glucose, Bld 114 (*)    Alkaline Phosphatase 129 (*)    Total Bilirubin 1.9 (*)    All other components within normal limits  URINALYSIS, ROUTINE W REFLEX MICROSCOPIC - Abnormal; Notable for the following components:   Ketones, ur 5 (*)    All other components within normal limits  LIPASE, BLOOD  I-STAT BETA HCG BLOOD, ED ([facility], [facility], [facility] ONLY)    EKG None  Radiology Ct Abdomen Pelvis W Contrast  Result Date: 11/15/2018 CLINICAL DATA:  Nausea, vomiting, abdominal pain EXAM: CT ABDOMEN AND PELVIS WITH CONTRAST TECHNIQUE: Multidetector CT imaging of the abdomen and pelvis was performed using the standard protocol following bolus administration of intravenous contrast. CONTRAST:  189m OMNIPAQUE IOHEXOL 300 MG/ML  SOLN COMPARISON:  None. FINDINGS: Lower chest: Lung bases are clear. Hepatobiliary: No focal hepatic lesion. No biliary duct dilatation. Gallbladder is normal. Common bile duct is normal. Pancreas: Pancreas is normal. No ductal dilatation. No pancreatic inflammation. Spleen: Normal spleen Adrenals/urinary tract: Adrenal glands and kidneys are normal. The ureters and bladder normal. Stomach/Bowel: Stomach, small bowel, appendix, and cecum are normal. The colon and rectosigmoid colon are normal. Vascular/Lymphatic: Abdominal aorta is normal caliber. No periportal or retroperitoneal adenopathy. No pelvic adenopathy. Reproductive: Uterus and ovaries are normal. Enhancing follicle within the LEFT ovary. Other: No free fluid. Musculoskeletal: No aggressive osseous lesion. IMPRESSION: 1. No acute abdominopelvic findings. 2. No explanation for abdominal pain, nausea or vomiting. Electronically Signed   By: SSuzy BouchardM.D.   On: 11/15/2018 12:14    Procedures Procedures (including critical care time)  Medications Ordered in  ED Medications  sodium chloride 0.9 % bolus 1,000 mL (0 mLs Intravenous Stopped 11/15/18 1014)  ondansetron (ZOFRAN) injection 4 mg (4 mg Intravenous Given 11/15/18 0745)  famotidine (PEPCID) IVPB 20 mg premix (0 mg Intravenous Stopped 11/15/18 0819)  alum & mag hydroxide-simeth (MAALOX/MYLANTA) 200-200-20 MG/5ML suspension 30 mL (30 mLs Oral Given 11/15/18 0750)    And  lidocaine (XYLOCAINE) 2 % viscous mouth solution 15 mL (15 mLs Oral Given 11/15/18 0750)  morphine 2 MG/ML injection 2 mg (2 mg Intravenous Given 11/15/18 0746)  ketorolac (TORADOL) 30 MG/ML injection 30 mg (30 mg Intravenous Given 11/15/18 0846)  sodium chloride 0.9 % bolus 1,000 mL (0 mLs Intravenous Stopped 11/15/18 1307)  iohexol (OMNIPAQUE) 300 MG/ML solution 100 mL (100 mLs Intravenous Contrast Given 11/15/18 1203)     Initial Impression / Assessment and Plan / ED Course  I have reviewed the triage vital signs and the nursing notes.  Pertinent labs & imaging results that were available during my care of the patient were reviewed by me and considered in my medical decision making (see chart for details).        Patient presenting for evaluation of generalized abdominal pain with associated nausea, vomiting, and diarrhea.  She is afebrile, vital signs are stable.  She is nontoxic in appearance.  No peritoneal signs on examination of the abdomen and she has a nonfocal exam.  Lab work reviewed by me shows mild nonspecific leukocytosis, slight elevations in total bilirubin and alk phos but otherwise LFTs are within normal limits.  No renal insufficiency, no metabolic derangements.  She has mild ketonuria suggesting dehydration but no evidence of UTI or nephrolithiasis.  Imaging of the abdomen and pelvis shows no acute findings.  No evidence of acute surgical abdominal pathology including  obstruction, perforation, appendicitis, cholecystitis, or dissection.  The low suspicion of ectopic pregnancy, TOA, ovarian torsion, or PID in the  absence of GU symptoms and with a negative pregnancy test.  She was given antiemetics and pain medicine in the ED with significant improvement and on reevaluation she is resting comfortably, reports she is feeling much better.  She is tolerating p.o. food and fluids without difficulty and serial abdominal examinations remain benign.  Discussed dietary modifications, advancing diet slowly, pushing fluids.  Will discharge with Zofran and Bentyl for presumed gastroenteritis versus GERD/PUD (she has Pepcid and Protonix at home).  Recommend follow-up with PCP for reevaluation of symptoms.  Discussed strict ED return precautions. Pt verbalized understanding of and agreement with plan and is safe for discharge home at this time.   Final Clinical Impressions(s) / ED Diagnoses   Final diagnoses:  Generalized abdominal pain  Nausea vomiting and diarrhea    ED Discharge Orders         Ordered    dicyclomine (BENTYL) 20 MG tablet  2 times daily PRN     11/15/18 1305    ondansetron (ZOFRAN ODT) 4 MG disintegrating tablet  Every 8 hours PRN     11/15/18 1305           Renita Papa, PA-C 11/15/18 1312    Davonna Belling, MD 11/15/18 1524

## 2018-11-15 NOTE — ED Notes (Signed)
Patient verbalizes understanding of discharge instructions. Opportunity for questioning and answers were provided. Armband removed by staff, pt discharged from ED.  

## 2018-11-15 NOTE — ED Notes (Signed)
Patient transported to CT 

## 2018-11-15 NOTE — ED Notes (Signed)
Patient returned from CT

## 2018-11-15 NOTE — ED Notes (Signed)
Notified EDP of decreasing bp and requested an order for IVF

## 2018-11-15 NOTE — Discharge Instructions (Addendum)
1. Medications: Alternate 600 mg of ibuprofen and [phone] mg of Tylenol every 3 hours as needed for pain. Do not exceed 4000 mg of Tylenol daily.  Take ibuprofen with food to avoid upset stomach.  Take ondansetron as needed for nausea.  Let this medicine dissolve under your tongue and wait around 10-20 minutes before eating or drinking after taking this medication.  You can take dicyclomine up to twice daily as needed for cramping pain in the abdomen.  Do not take it if you are not having any pain. 2. Treatment: rest, drink plenty of fluids, advance diet slowly.  Start with water and broth then advance to bland foods that will not upset your stomach.  I have attached information about foods that you should avoid and foods that are safer to eat. 3. Follow Up: Please followup with your primary doctor in 3 days for discussion of your diagnoses and further evaluation after today's visit; if you do not have a primary care doctor use the resource guide provided to find one; Please return to the ER for persistent vomiting, high fevers or worsening symptoms

## 2018-11-15 NOTE — ED Triage Notes (Signed)
Pt reports generalized abd pain that started last night. Endorses N/V/D. Pt states she has taken her meds that were prescribed last week.

## 2019-05-17 ENCOUNTER — Other Ambulatory Visit: Payer: Self-pay | Admitting: Obstetrics and Gynecology

## 2019-05-17 DIAGNOSIS — R109 Unspecified abdominal pain: Secondary | ICD-10-CM

## 2019-05-20 ENCOUNTER — Other Ambulatory Visit: Payer: Self-pay | Admitting: Obstetrics and Gynecology

## 2019-05-20 DIAGNOSIS — R109 Unspecified abdominal pain: Secondary | ICD-10-CM

## 2019-05-21 ENCOUNTER — Other Ambulatory Visit: Payer: Medicaid Other

## 2019-05-22 ENCOUNTER — Ambulatory Visit
Admission: RE | Admit: 2019-05-22 | Discharge: 2019-05-22 | Disposition: A | Discharge status: Home / Self Care | Payer: Medicaid Other | Source: Ambulatory Visit | Attending: Obstetrics and Gynecology | Admitting: Obstetrics and Gynecology

## 2019-05-22 DIAGNOSIS — R109 Unspecified abdominal pain: Secondary | ICD-10-CM

## 2019-07-01 ENCOUNTER — Encounter: Payer: Self-pay | Admitting: *Deleted

## 2019-07-08 ENCOUNTER — Encounter: Payer: Self-pay | Admitting: Obstetrics & Gynecology

## 2019-07-08 ENCOUNTER — Ambulatory Visit (INDEPENDENT_AMBULATORY_CARE_PROVIDER_SITE_OTHER): Payer: Medicaid Other | Admitting: Obstetrics & Gynecology

## 2019-07-08 ENCOUNTER — Other Ambulatory Visit: Payer: Self-pay

## 2019-07-08 ENCOUNTER — Other Ambulatory Visit (HOSPITAL_COMMUNITY)
Admission: RE | Admit: 2019-07-08 | Discharge: 2019-07-08 | Disposition: A | Discharge status: Home / Self Care | Payer: Medicaid Other | Source: Ambulatory Visit | Attending: Obstetrics & Gynecology | Admitting: Obstetrics & Gynecology

## 2019-07-08 VITALS — BP 106/65 | HR 59 | Wt 139.3 lb

## 2019-07-08 DIAGNOSIS — N760 Acute vaginitis: Secondary | ICD-10-CM

## 2019-07-08 NOTE — Patient Instructions (Signed)
Vaginitis Vaginitis is a condition in which the vaginal tissue swells and becomes red (inflamed). This condition is most often caused by a change in the normal balance of bacteria and yeast that live in the vagina. This change causes an overgrowth of certain bacteria or yeast, which causes the inflammation. There are different types of vaginitis, but the most common types are:  Bacterial vaginosis.  Yeast infection (candidiasis).  Trichomoniasis vaginitis. This is a sexually transmitted disease (STD).  Viral vaginitis.  Atrophic vaginitis.  Allergic vaginitis. What are the causes? The cause of this condition depends on the type of vaginitis. It can be caused by:  Bacteria (bacterial vaginosis).  Yeast, which is a fungus (yeast infection).  A parasite (trichomoniasis vaginitis).  A virus (viral vaginitis).  Low hormone levels (atrophic vaginitis). Low hormone levels can occur during pregnancy, breastfeeding, or after menopause.  Irritants, such as bubble baths, scented tampons, and feminine sprays (allergic vaginitis). Other factors can change the normal balance of the yeast and bacteria that live in the vagina. These include:  Antibiotic medicines.  Poor hygiene.  Diaphragms, vaginal sponges, spermicides, birth control pills, and intrauterine devices (IUD).  Sex.  Infection.  Uncontrolled diabetes.  A weakened defense (immune) system. What increases the risk? This condition is more likely to develop in women who:  Smoke.  Use vaginal douches, scented tampons, or scented sanitary pads.  Wear tight-fitting pants.  Wear thong underwear.  Use oral birth control pills or an IUD.  Have sex without a condom.  Have multiple sex partners.  Have an STD.  Frequently use the spermicide nonoxynol-9.  Eat lots of foods high in sugar.  Have uncontrolled diabetes.  Have low estrogen levels.  Have a weakened immune system from an immune disorder or medical  treatment.  Are pregnant or breastfeeding. What are the signs or symptoms? Symptoms vary depending on the cause of the vaginitis. Common symptoms include:  Abnormal vaginal discharge. ? The discharge is white, gray, or yellow with bacterial vaginosis. ? The discharge is thick, white, and cheesy with a yeast infection. ? The discharge is frothy and yellow or greenish with trichomoniasis.  A bad vaginal smell. The smell is fishy with bacterial vaginosis.  Vaginal itching, pain, or swelling.  Sex that is painful.  Pain or burning when urinating. Sometimes there are no symptoms. How is this diagnosed? This condition is diagnosed based on your symptoms and medical history. A physical exam, including a pelvic exam, will also be done. You may also have other tests, including:  Tests to determine the pH level (acidity or alkalinity) of your vagina.  A whiff test, to assess the odor that results when a sample of your vaginal discharge is mixed with a potassium hydroxide solution.  Tests of vaginal fluid. A sample will be examined under a microscope. How is this treated? Treatment varies depending on the type of vaginitis you have. Your treatment may include:  Antibiotic creams or pills to treat bacterial vaginosis and trichomoniasis.  Antifungal medicines, such as vaginal creams or suppositories, to treat a yeast infection.  Medicine to ease discomfort if you have viral vaginitis. Your sexual partner should also be treated.  Estrogen delivered in a cream, pill, suppository, or vaginal ring to treat atrophic vaginitis. If vaginal dryness occurs, lubricants and moisturizing creams may help. You may need to avoid scented soaps, sprays, or douches.  Stopping use of a product that is causing allergic vaginitis. Then using a vaginal cream to treat the symptoms. Follow   these instructions at home: Lifestyle  Keep your genital area clean and dry. Avoid soap, and only rinse the area with  water.  Do not douche or use tampons until your health care provider says it is okay to do so. Use sanitary pads, if needed.  Do not have sex until your health care provider approves. When you can return to sex, practice safe sex and use condoms.  Wipe from front to back. This avoids the spread of bacteria from the rectum to the vagina. General instructions  Take over-the-counter and prescription medicines only as told by your health care provider.  If you were prescribed an antibiotic medicine, take or use it as told by your health care provider. Do not stop taking or using the antibiotic even if you start to feel better.  Keep all follow-up visits as told by your health care provider. This is important. How is this prevented?  Use mild, non-scented products. Do not use things that can irritate the vagina, such as fabric softeners. Avoid the following products if they are scented: ? Feminine sprays. ? Detergents. ? Tampons. ? Feminine hygiene products. ? Soaps or bubble baths.  Let air reach your genital area. ? Wear cotton underwear to reduce moisture buildup. ? Avoid wearing underwear while you sleep. ? Avoid wearing tight pants and underwear or nylons without a cotton panel. ? Avoid wearing thong underwear.  Take off any wet clothing, such as bathing suits, as soon as possible.  Practice safe sex and use condoms. Contact a health care provider if:  You have abdominal pain.  You have a fever.  You have symptoms that last for more than 2-3 days. Get help right away if:  You have a fever and your symptoms suddenly get worse. Summary  Vaginitis is a condition in which the vaginal tissue becomes inflamed.This condition is most often caused by a change in the normal balance of bacteria and yeast that live in the vagina.  Treatment varies depending on the type of vaginitis you have.  Do not douche, use tampons , or have sex until your health care provider approves. When  you can return to sex, practice safe sex and use condoms. This information is not intended to replace advice given to you by your health care provider. Make sure you discuss any questions you have with your health care provider. Document Revised: 02/17/2017 Document Reviewed: 04/12/2016 Elsevier Patient Education  2020 Elsevier Inc.  

## 2019-07-08 NOTE — Progress Notes (Signed)
   GYNECOLOGY OFFICE VISIT NOTE  History:   Valerie Ward is a 24 y.o. G1P1001 here today after being referred from [facility] for evaluation of irregular menstrual periods. However, during her intake process with RN, patient reports having her periods every month so she actually has regular periods.  She does have vulvar itching for a few days and wants evaluation for this.  Upon further discussion, she reported that her GYN was Chief Financial Officer ([facility]). She wanted to proceed with vulvovaginitis evaluation here today though.  She denies any abnormal vaginal bleeding, pelvic pain or other concerns.    Past Medical History:  Diagnosis Date  . Medical history non-contributory     Past Surgical History:  Procedure Laterality Date  . NO PAST SURGERIES      The following portions of the patient's history were reviewed and updated as appropriate: allergies, current medications, past family history, past medical history, past social history, past surgical history and problem list.   Health Maintenance:  Normal pap recently at [facility] as per patient.   Review of Systems:  Pertinent items noted in HPI and remainder of comprehensive ROS otherwise negative.  Physical Exam:  BP 106/65   Pulse (!) 59   Wt 139 lb 4.8 oz (63.2 kg)   LMP 06/28/2019   Breastfeeding No   BMI 23.91 kg/m  CONSTITUTIONAL: Well-developed, well-nourished female in no acute distress.  HEENT:  Normocephalic, atraumatic. External right and left ear normal. No scleral icterus.  NECK: Normal range of motion, supple, no masses noted on observation SKIN: No rash noted. Not diaphoretic. No erythema. No pallor. MUSCULOSKELETAL: Normal range of motion. No edema noted. NEUROLOGIC: Alert and oriented to person, place, and time. Normal muscle tone coordination. No cranial nerve deficit noted. PSYCHIATRIC: Normal mood and affect. Normal behavior. Normal judgment and thought content. CARDIOVASCULAR: Normal heart rate noted RESPIRATORY: Effort  and breath sounds normal, no problems with respiration noted ABDOMEN: No masses noted. No other overt distention noted.   PELVIC: Deferred by patient. Self-swab done.      Assessment and Plan:    1. Vulvovaginitis Proper vulvar hygiene emphasized: discussed avoidance of perfumed soaps, detergents, lotions and any type of douches; in addition to wearing cotton underwear and no underwear at night.  Also recommended cleaning front to back, voiding and cleaning up after intercourse.    - Cervicovaginal ancillary only( [place]) done, will follow up results and manage accordingly. If testing negative, she can try OTC hydrocortisone cream applied externally.  Routine preventative health maintenance measures emphasized, follow up with her primary GYN for any GYN concerns. Please refer to After Visit Summary for other counseling recommendations.     Total face-to-face time with patient: 15 minutes.  Over 50% of encounter was spent on counseling and coordination of care.   Jaynie Collins, MD, FACOG Obstetrician & Gynecologist, [facility] for [employer], [facility]

## 2019-07-09 LAB — CERVICOVAGINAL ANCILLARY ONLY
Bacterial Vaginitis (gardnerella): NEGATIVE
Candida Glabrata: NEGATIVE
Candida Vaginitis: NEGATIVE
Chlamydia: NEGATIVE
Comment: NEGATIVE
Comment: NEGATIVE
Comment: NEGATIVE
Comment: NEGATIVE
Comment: NEGATIVE
Comment: NORMAL
Neisseria Gonorrhea: NEGATIVE
Trichomonas: NEGATIVE

## 2020-10-22 IMAGING — CT CT ANGIOGRAPHY CHEST
2 of 7 series · 19 of 46 positions shown · IV contrast (APPLIED)
Comparison: None

CLINICAL DATA: LEFT upper chest pain, elevated D-dimer, postpartum
in [REDACTED], question pulmonary embolism, intermediate clinical
probability

EXAM:
CT ANGIOGRAPHY CHEST WITH CONTRAST
TECHNIQUE: Multidetector CT imaging of the chest was performed using the
standard protocol during bolus administration of intravenous
contrast. Multiplanar CT image reconstructions and MIPs were
obtained to evaluate the vascular anatomy.
CONTRAST:  90mL OMNIPAQUE IOHEXOL 350 MG/ML SOLN IV

[Series 7: thins · axial · 0.61mm/px · z∈[-402,-167]mm · 16 of 380 slices shown]
[im 22/380  lung]
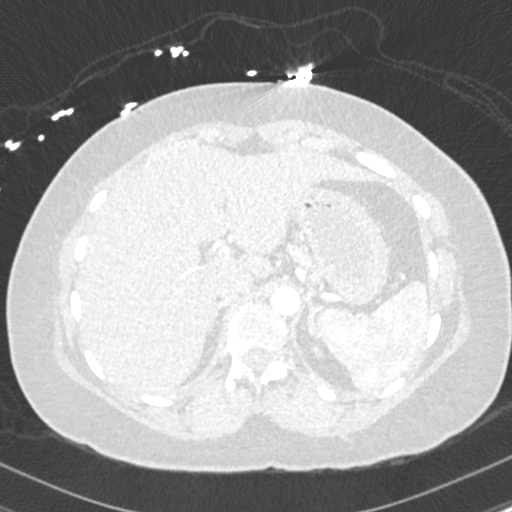
[im 43/380  soft-tissue]
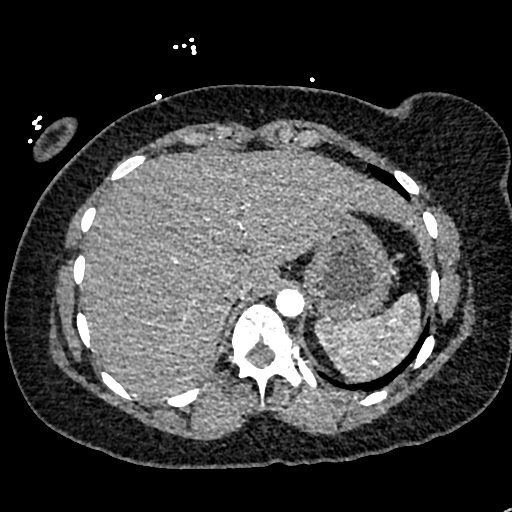
[im 64/380  lung]
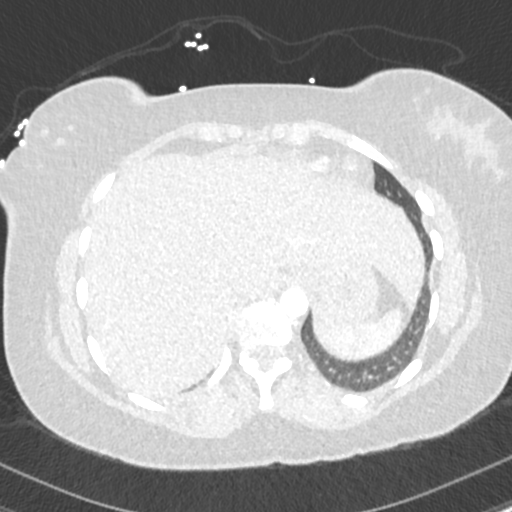
[im 85/380  soft-tissue]
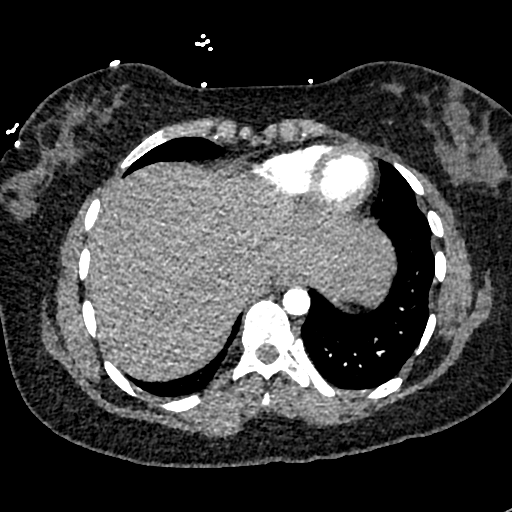
[im 106/380  lung]
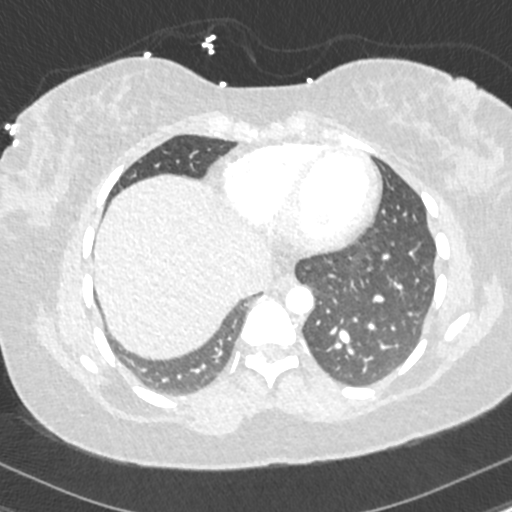
[im 127/380  soft-tissue]
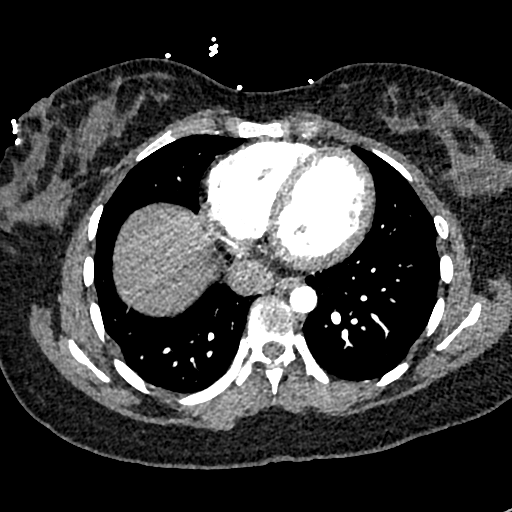
[im 148/380  lung]
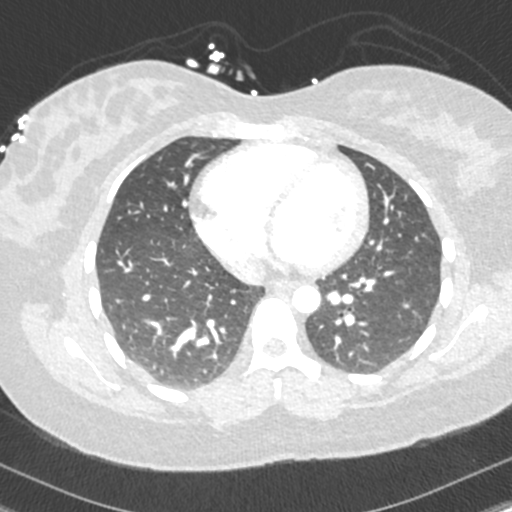
[im 169/380  soft-tissue]
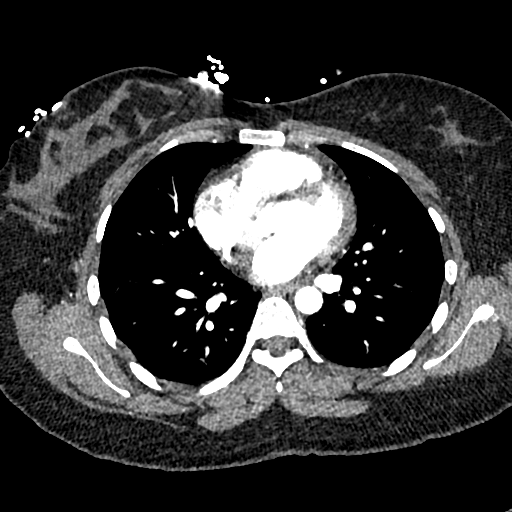
[im 211/380  lung]
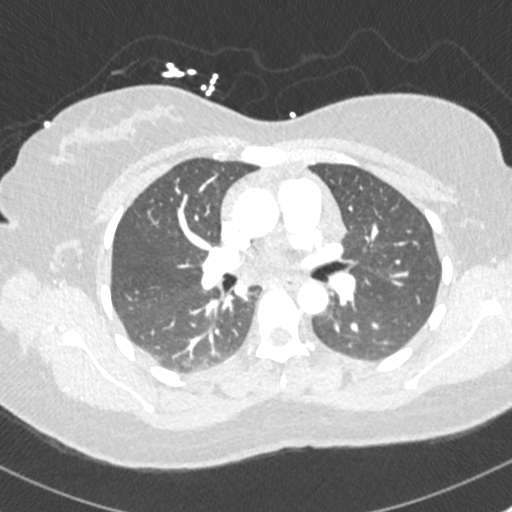
[im 232/380  soft-tissue]
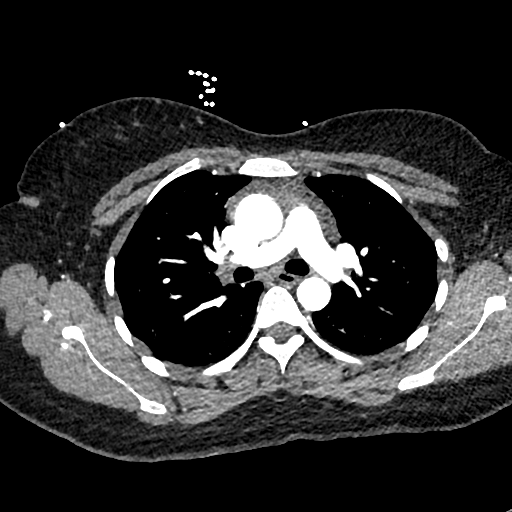
[im 253/380  lung]
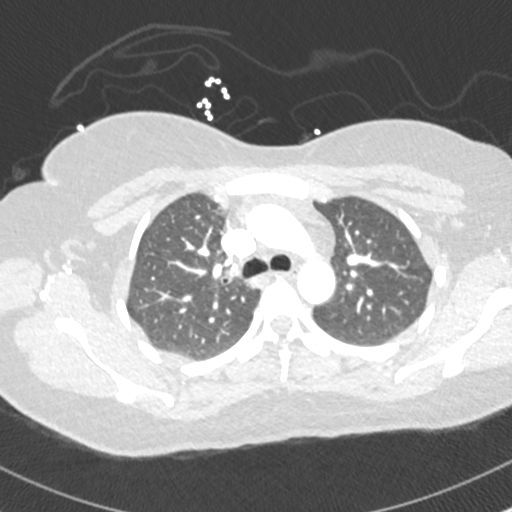
[im 274/380  soft-tissue]
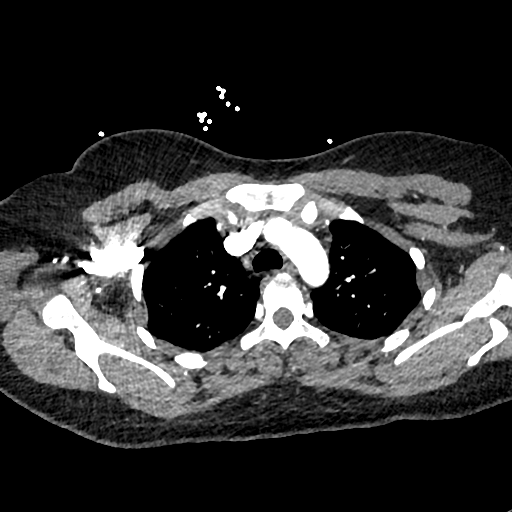
[im 295/380  lung]
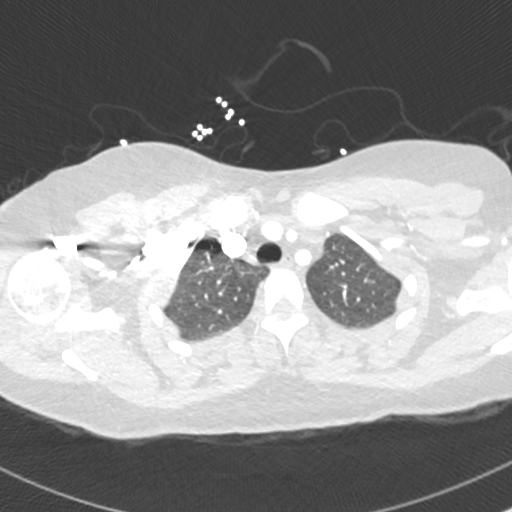
[im 316/380  soft-tissue]
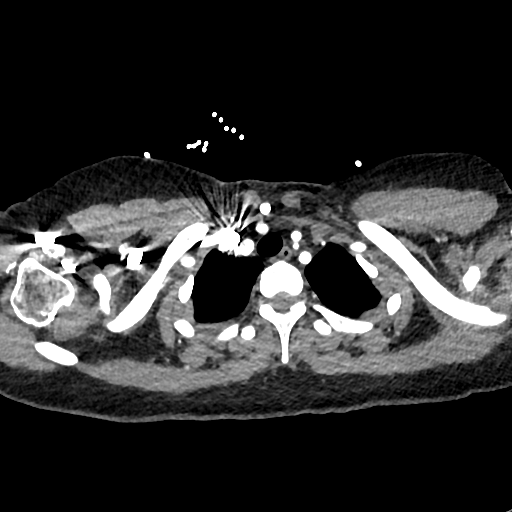
[im 337/380  lung]
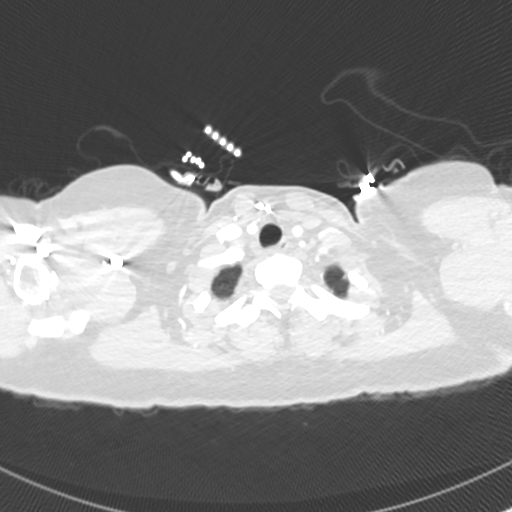
[im 358/380  soft-tissue]
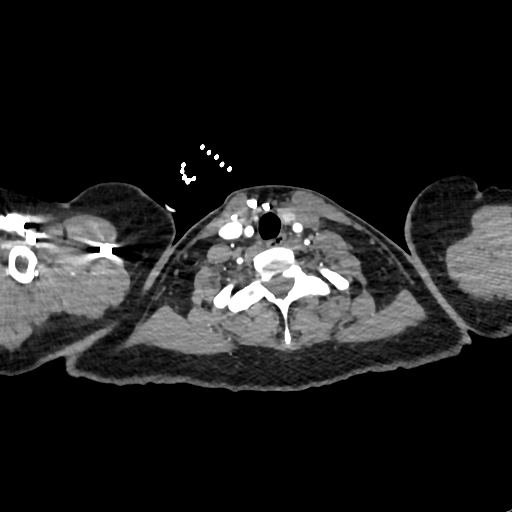

[Series 8: cor · coronal · 0.54mm/px · 3 of 129 slices shown]
[im 33/129  soft-tissue]
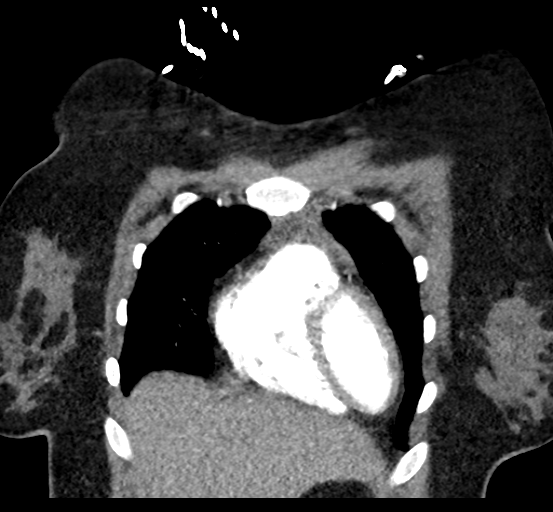
[im 65/129  soft-tissue]
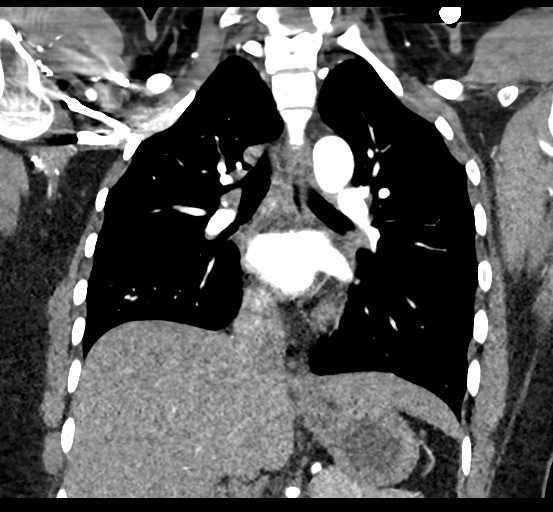
[im 97/129  soft-tissue]
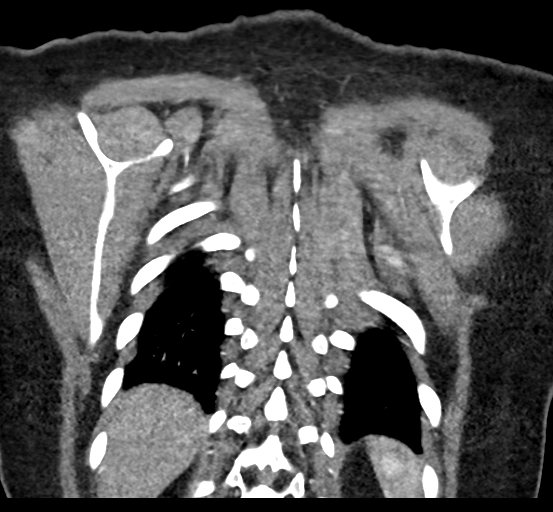

[19 of 46 positions shown; findings below may reference images not displayed]

FINDINGS: Cardiovascular: Aorta normal caliber without aneurysm or dissection.
No pericardial effusion. Pulmonary arteries well opacified and
patent. No evidence of pulmonary embolism.

Mediastinum/Nodes: Residual thymic tissue in anterior mediastinum.
Base of cervical region normal appearance. No thoracic adenopathy.
Esophagus unremarkable.

Lungs/Pleura: Lungs clear. No pulmonary infiltrate, pleural effusion
or pneumothorax.

Upper Abdomen: Visualized upper abdomen unremarkable

Musculoskeletal: Osseous structures unremarkable.

Review of the MIP images confirms the above findings.
IMPRESSION: Normal CTA chest.

No evidence of pulmonary embolism.

## 2021-01-13 IMAGING — CT CT ABDOMEN AND PELVIS WITH CONTRAST
2 of 4 series · 16 of 46 positions shown, 18 images · IV contrast (omnipaque)
Comparison: None.

CLINICAL DATA: Nausea, vomiting, abdominal pain

EXAM:
CT ABDOMEN AND PELVIS WITH CONTRAST
TECHNIQUE: Multidetector CT imaging of the abdomen and pelvis was performed
using the standard protocol following bolus administration of
intravenous contrast.
CONTRAST:  100mL OMNIPAQUE IOHEXOL 300 MG/ML  SOLN

[Series 3: abdomen 5.0 · axial · 0.73mm/px · z∈[-368,+47]mm · 13 of 93 slices shown, 15 images]
[im 5/93  soft-tissue]
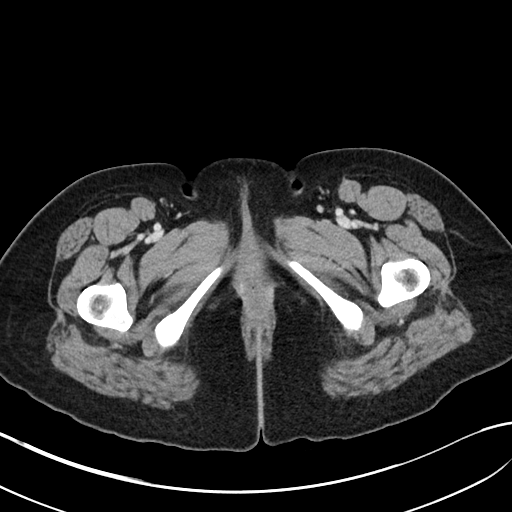
[im 5/93  bone]
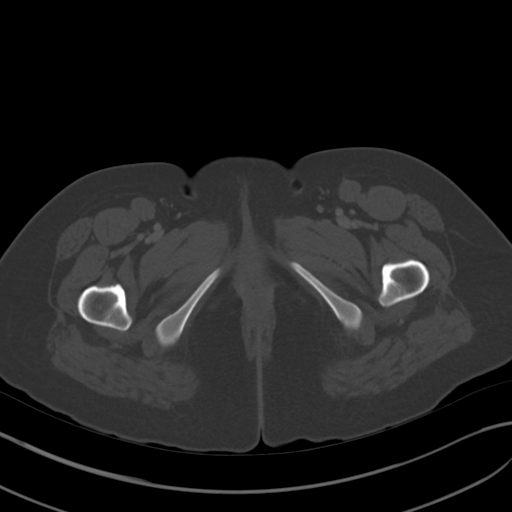
[im 15/93  soft-tissue]
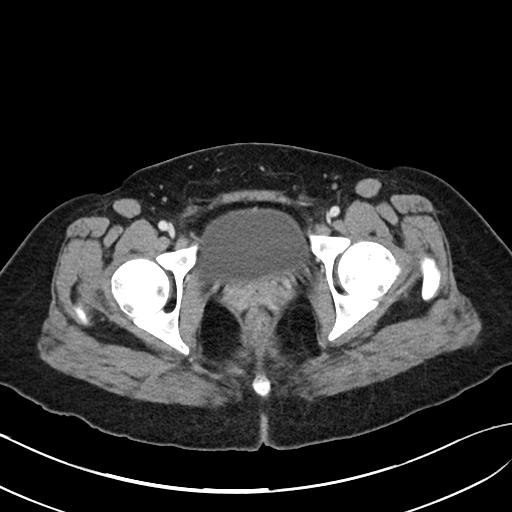
[im 20/93  soft-tissue]
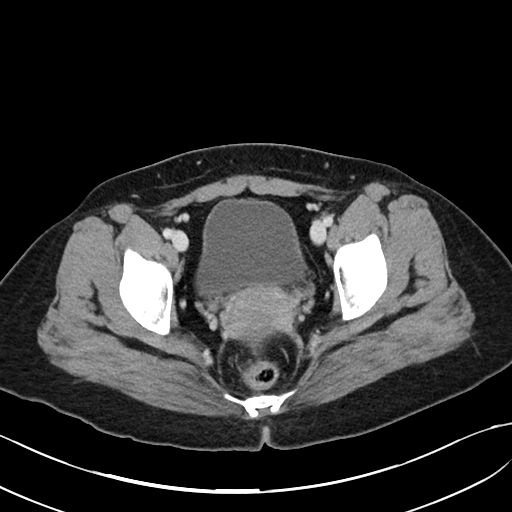
[im 25/93  soft-tissue]
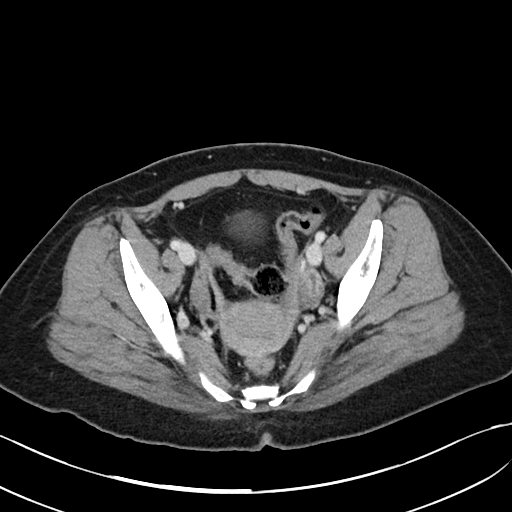
[im 34/93  soft-tissue]
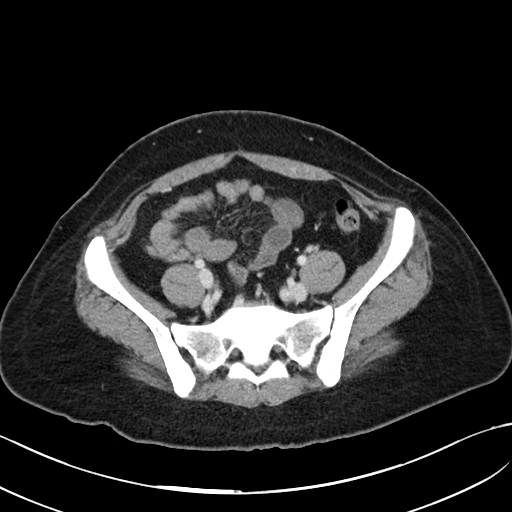
[im 39/93  soft-tissue]
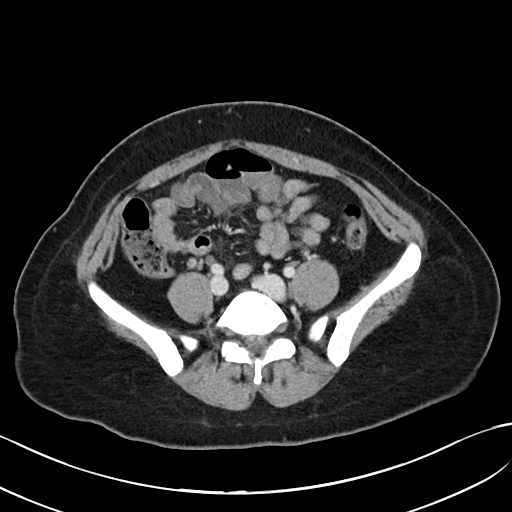
[im 49/93  soft-tissue]
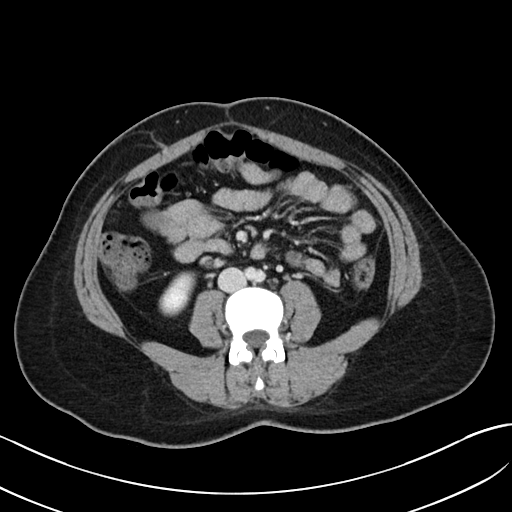
[im 54/93  soft-tissue]
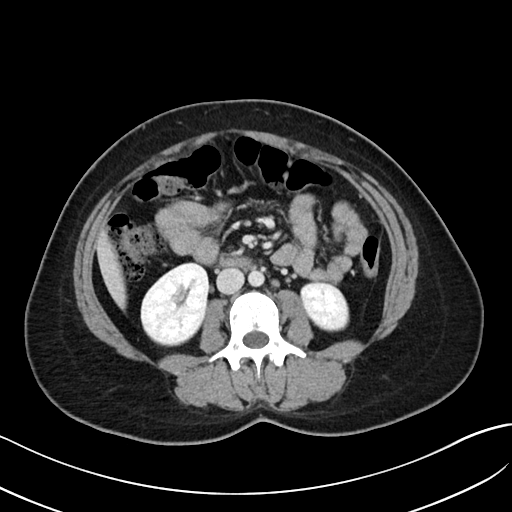
[im 59/93  soft-tissue]
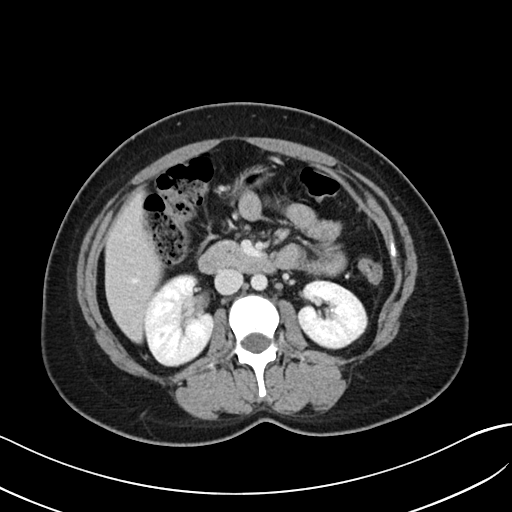
[im 59/93  bone]
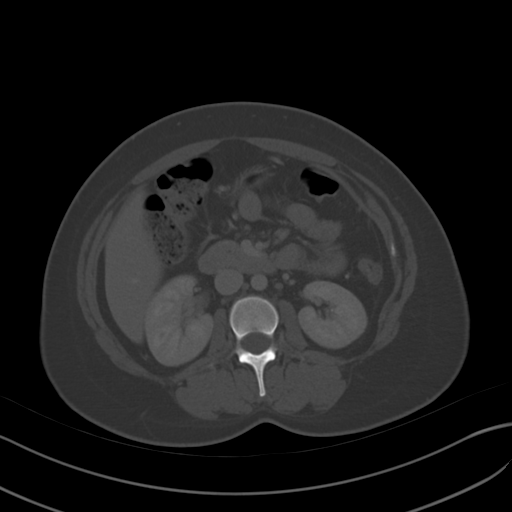
[im 68/93  soft-tissue]
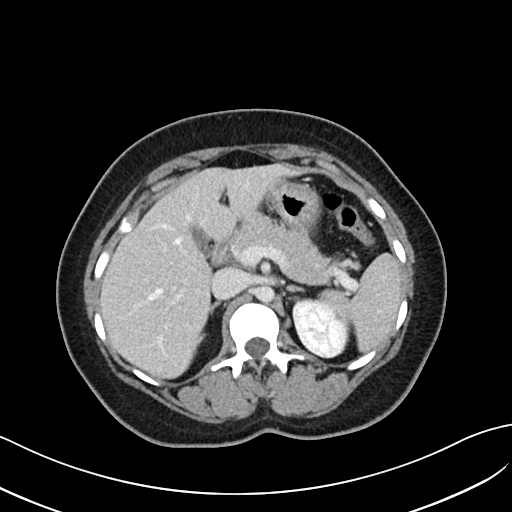
[im 73/93  soft-tissue]
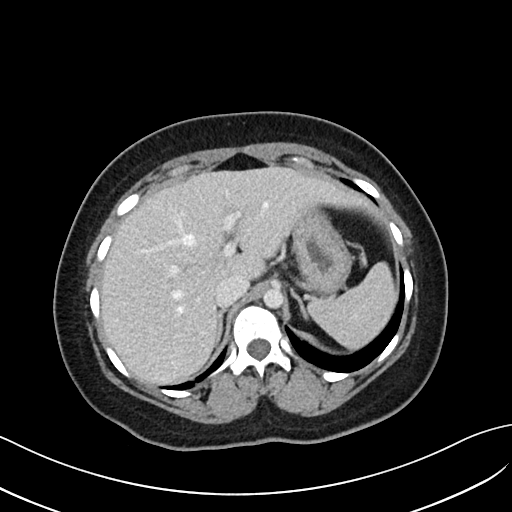
[im 78/93  soft-tissue]
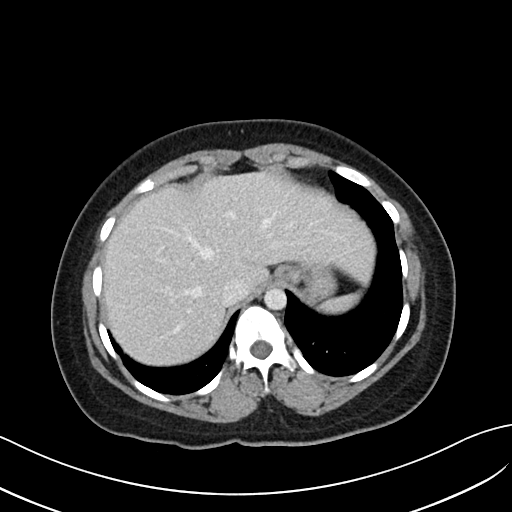
[im 88/93  soft-tissue]
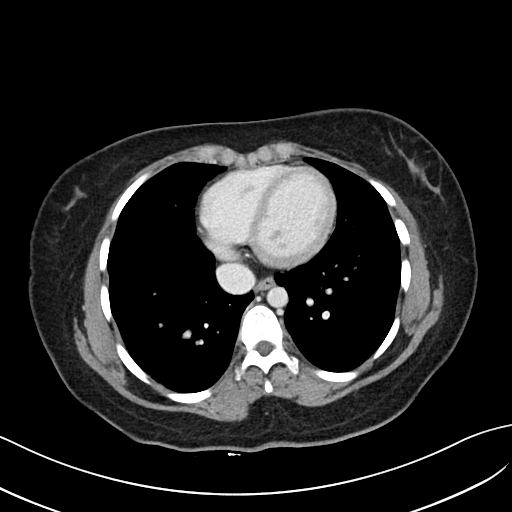

[Series 6: abdomen 3.0 mpr cor · coronal · 0.63mm/px · 3 of 88 slices shown]
[im 30/88  soft-tissue]
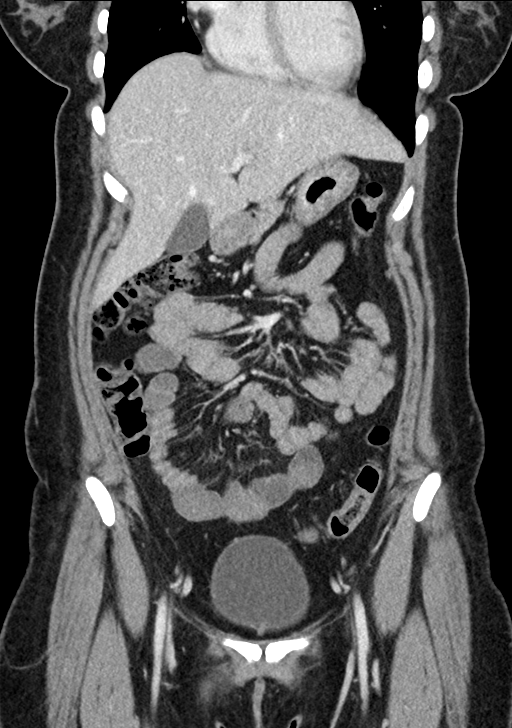
[im 39/88  soft-tissue]
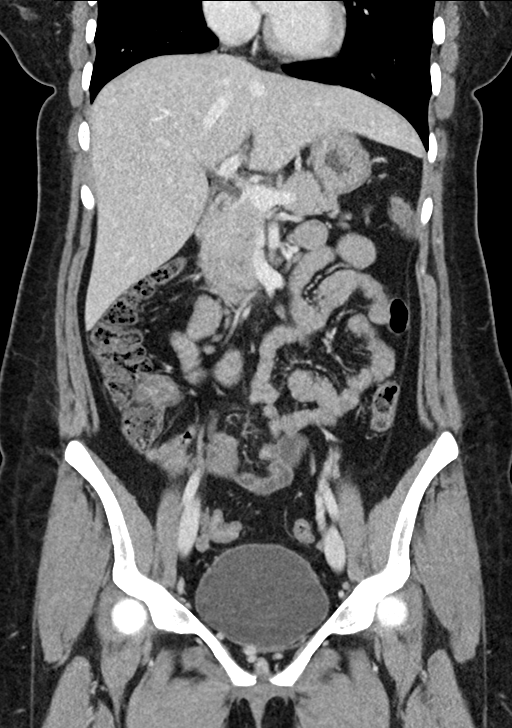
[im 49/88  soft-tissue]
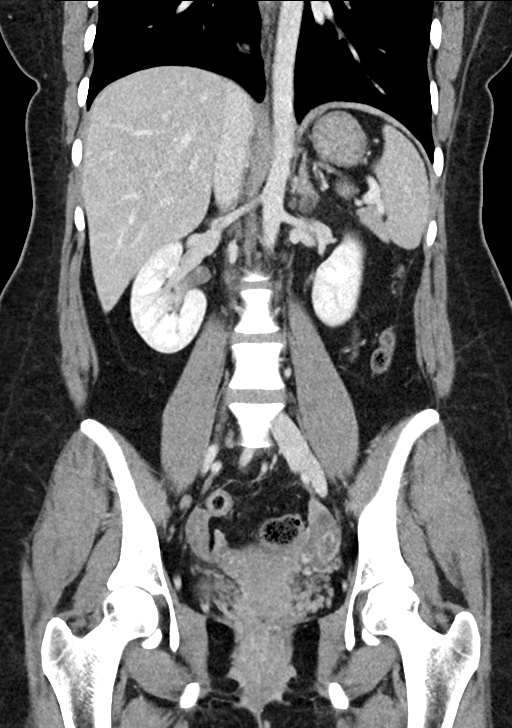

[16 of 46 positions shown; findings below may reference images not displayed]

FINDINGS: Lower chest: Lung bases are clear.

Hepatobiliary: No focal hepatic lesion. No biliary duct dilatation.
Gallbladder is normal. Common bile duct is normal.

Pancreas: Pancreas is normal. No ductal dilatation. No pancreatic
inflammation.

Spleen: Normal spleen

Adrenals/urinary tract: Adrenal glands and kidneys are normal. The
ureters and bladder normal.

Stomach/Bowel: Stomach, small bowel, appendix, and cecum are normal.
The colon and rectosigmoid colon are normal.

Vascular/Lymphatic: Abdominal aorta is normal caliber. No periportal
or retroperitoneal adenopathy. No pelvic adenopathy.

Reproductive: Uterus and ovaries are normal. Enhancing follicle
within the LEFT ovary.

Other: No free fluid.

Musculoskeletal: No aggressive osseous lesion.
IMPRESSION: 1. No acute abdominopelvic findings.
2. No explanation for abdominal pain, nausea or vomiting.

## 2021-03-18 LAB — OB RESULTS CONSOLE HEPATITIS B SURFACE ANTIGEN: Hepatitis B Surface Ag: NEGATIVE

## 2021-03-18 LAB — OB RESULTS CONSOLE ABO/RH: RH Type: POSITIVE

## 2021-03-18 LAB — OB RESULTS CONSOLE RPR: RPR: NONREACTIVE

## 2021-03-18 LAB — OB RESULTS CONSOLE RUBELLA ANTIBODY, IGM: Rubella: IMMUNE

## 2021-03-18 LAB — HEPATITIS C ANTIBODY: HCV Ab: NEGATIVE

## 2021-03-18 LAB — OB RESULTS CONSOLE HIV ANTIBODY (ROUTINE TESTING): HIV: NONREACTIVE

## 2021-03-18 LAB — OB RESULTS CONSOLE ANTIBODY SCREEN: Antibody Screen: NEGATIVE

## 2021-03-25 LAB — OB RESULTS CONSOLE GC/CHLAMYDIA
Chlamydia: NEGATIVE
Neisseria Gonorrhea: NEGATIVE

## 2021-04-03 ENCOUNTER — Other Ambulatory Visit: Payer: Self-pay | Admitting: Physician Assistant

## 2021-04-23 ENCOUNTER — Other Ambulatory Visit: Payer: Self-pay | Admitting: Physician Assistant

## 2021-04-23 DIAGNOSIS — N6459 Other signs and symptoms in breast: Secondary | ICD-10-CM

## 2021-05-08 ENCOUNTER — Other Ambulatory Visit: Payer: Medicaid Other

## 2021-06-04 ENCOUNTER — Other Ambulatory Visit: Payer: Self-pay

## 2021-06-17 ENCOUNTER — Ambulatory Visit
Admission: RE | Admit: 2021-06-17 | Discharge: 2021-06-17 | Disposition: A | Discharge status: Home / Self Care | Payer: Medicaid Other | Source: Ambulatory Visit | Attending: Physician Assistant | Admitting: Physician Assistant

## 2021-06-17 DIAGNOSIS — N6459 Other signs and symptoms in breast: Secondary | ICD-10-CM

## 2021-08-17 LAB — OB RESULTS CONSOLE GBS: GBS: NEGATIVE

## 2021-09-10 ENCOUNTER — Telehealth (HOSPITAL_COMMUNITY): Payer: Self-pay | Admitting: *Deleted

## 2021-09-10 ENCOUNTER — Encounter (HOSPITAL_COMMUNITY): Payer: Self-pay | Admitting: *Deleted

## 2021-09-10 ENCOUNTER — Other Ambulatory Visit: Payer: Self-pay | Admitting: Obstetrics & Gynecology

## 2021-09-10 ENCOUNTER — Encounter (HOSPITAL_COMMUNITY): Payer: Self-pay

## 2021-09-10 NOTE — Telephone Encounter (Signed)
Preadmission screen  Interpreter number [phone]

## 2021-09-14 ENCOUNTER — Inpatient Hospital Stay (HOSPITAL_COMMUNITY)
Admission: AD | Admit: 2021-09-14 | Discharge: 2021-09-16 | DRG: 805 | Disposition: A | Discharge status: Home / Self Care | Payer: Medicaid Other | Attending: Obstetrics & Gynecology | Admitting: Obstetrics & Gynecology

## 2021-09-14 ENCOUNTER — Encounter (HOSPITAL_COMMUNITY): Payer: Self-pay | Admitting: Obstetrics and Gynecology

## 2021-09-14 ENCOUNTER — Inpatient Hospital Stay (HOSPITAL_COMMUNITY): Payer: Medicaid Other | Admitting: Anesthesiology

## 2021-09-14 ENCOUNTER — Other Ambulatory Visit: Payer: Self-pay

## 2021-09-14 DIAGNOSIS — O480 Post-term pregnancy: Principal | ICD-10-CM | POA: Diagnosis present

## 2021-09-14 DIAGNOSIS — Z3A40 40 weeks gestation of pregnancy: Secondary | ICD-10-CM

## 2021-09-14 DIAGNOSIS — O411230 Chorioamnionitis, third trimester, not applicable or unspecified: Secondary | ICD-10-CM | POA: Diagnosis present

## 2021-09-14 LAB — CBC
HCT: 37 % (ref 36.0–46.0)
Hemoglobin: 12.8 g/dL (ref 12.0–15.0)
MCH: 32.2 pg (ref 26.0–34.0)
MCHC: 34.6 g/dL (ref 30.0–36.0)
MCV: 93 fL (ref 80.0–100.0)
Platelets: 121 10*3/uL — ABNORMAL LOW (ref 150–400)
RBC: 3.98 MIL/uL (ref 3.87–5.11)
RDW: 13.6 % (ref 11.5–15.5)
WBC: 14 10*3/uL — ABNORMAL HIGH (ref 4.0–10.5)
nRBC: 0.1 % (ref 0.0–0.2)

## 2021-09-14 LAB — TYPE AND SCREEN
ABO/RH(D): O POS
Antibody Screen: NEGATIVE

## 2021-09-14 LAB — RPR: RPR Ser Ql: NONREACTIVE

## 2021-09-14 MED ORDER — OXYTOCIN-SODIUM CHLORIDE 30-0.9 UT/500ML-% IV SOLN
2.5000 [IU]/h | INTRAVENOUS | Status: DC
Start: 2021-09-14 — End: 2021-09-14

## 2021-09-14 MED ORDER — DIBUCAINE (PERIANAL) 1 % EX OINT
1.0000 | TOPICAL_OINTMENT | CUTANEOUS | Status: DC | PRN
Start: 2021-09-14 — End: 2021-09-16

## 2021-09-14 MED ORDER — PHENYLEPHRINE 80 MCG/ML (10ML) SYRINGE FOR IV PUSH (FOR BLOOD PRESSURE SUPPORT)
80.0000 ug | PREFILLED_SYRINGE | INTRAVENOUS | Status: DC | PRN
  Administered 2021-09-14 (×2): 80 ug via INTRAVENOUS

## 2021-09-14 MED ORDER — OXYTOCIN BOLUS FROM INFUSION
333.0000 mL | Freq: Once | INTRAVENOUS | Status: AC
Start: 2021-09-14 — End: 2021-09-14
  Administered 2021-09-14: 333 mL via INTRAVENOUS

## 2021-09-14 MED ORDER — WITCH HAZEL-GLYCERIN EX PADS: 1.0000 | MEDICATED_PAD | CUTANEOUS | Status: DC | PRN

## 2021-09-14 MED ORDER — TETANUS-DIPHTH-ACELL PERTUSSIS 5-2.5-18.5 LF-MCG/0.5 IM SUSY: 0.5000 mL | PREFILLED_SYRINGE | Freq: Once | INTRAMUSCULAR | Status: DC

## 2021-09-14 MED ORDER — LIDOCAINE HCL (PF) 1 % IJ SOLN: 30.0000 mL | INTRAMUSCULAR | Status: DC | PRN

## 2021-09-14 MED ORDER — SOD CITRATE-CITRIC ACID 500-334 MG/5ML PO SOLN: 30.0000 mL | ORAL | Status: DC | PRN

## 2021-09-14 MED ORDER — ZOLPIDEM TARTRATE 5 MG PO TABS: 5.0000 mg | ORAL_TABLET | Freq: Every evening | ORAL | Status: DC | PRN

## 2021-09-14 MED ORDER — LACTATED RINGERS IV SOLN
500.0000 mL | INTRAVENOUS | Status: DC | PRN
  Administered 2021-09-14: 500 mL via INTRAVENOUS

## 2021-09-14 MED ORDER — ONDANSETRON HCL 4 MG PO TABS: 4.0000 mg | ORAL_TABLET | ORAL | Status: DC | PRN

## 2021-09-14 MED ORDER — ACETAMINOPHEN 325 MG PO TABS
650.0000 mg | ORAL_TABLET | ORAL | Status: DC | PRN
  Administered 2021-09-14 – 2021-09-16 (×3): 650 mg via ORAL
  Filled 2021-09-14 (×3): qty 2

## 2021-09-14 MED ORDER — OXYTOCIN-SODIUM CHLORIDE 30-0.9 UT/500ML-% IV SOLN: 2.5000 [IU]/h | INTRAVENOUS | Status: DC | PRN

## 2021-09-14 MED ORDER — IBUPROFEN 600 MG PO TABS
600.0000 mg | ORAL_TABLET | Freq: Four times a day (QID) | ORAL | Status: DC
  Administered 2021-09-14 – 2021-09-16 (×8): 600 mg via ORAL
  Filled 2021-09-14 (×9): qty 1

## 2021-09-14 MED ORDER — TERBUTALINE SULFATE 1 MG/ML IJ SOLN: 0.2500 mg | Freq: Once | INTRAMUSCULAR | Status: DC | PRN

## 2021-09-14 MED ORDER — PRENATAL MULTIVITAMIN CH
1.0000 | ORAL_TABLET | Freq: Every day | ORAL | Status: DC
  Administered 2021-09-15 – 2021-09-16 (×2): 1 via ORAL
  Filled 2021-09-14 (×2): qty 1

## 2021-09-14 MED ORDER — ACETAMINOPHEN 325 MG PO TABS
650.0000 mg | ORAL_TABLET | ORAL | Status: DC | PRN
  Administered 2021-09-14: 650 mg via ORAL
  Filled 2021-09-14: qty 2

## 2021-09-14 MED ORDER — EPHEDRINE 5 MG/ML INJ: 10.0000 mg | INTRAVENOUS | Status: DC | PRN

## 2021-09-14 MED ORDER — NALBUPHINE HCL 10 MG/ML IJ SOLN
10.0000 mg | Freq: Once | INTRAMUSCULAR | Status: AC | PRN
  Administered 2021-09-14: 10 mg via INTRAVENOUS
  Filled 2021-09-14: qty 1

## 2021-09-14 MED ORDER — COCONUT OIL OIL
1.0000 | TOPICAL_OIL | Status: DC | PRN
Start: 2021-09-14 — End: 2021-09-16

## 2021-09-14 MED ORDER — LACTATED RINGERS IV SOLN: INTRAVENOUS | Status: DC

## 2021-09-14 MED ORDER — METHYLERGONOVINE MALEATE 0.2 MG/ML IJ SOLN: 0.2000 mg | INTRAMUSCULAR | Status: DC | PRN

## 2021-09-14 MED ORDER — LACTATED RINGERS AMNIOINFUSION: INTRAVENOUS | Status: DC

## 2021-09-14 MED ORDER — LIDOCAINE HCL (PF) 1 % IJ SOLN
INTRAMUSCULAR | Status: DC | PRN
  Administered 2021-09-14: 5 mL via EPIDURAL
  Administered 2021-09-14: 3 mL via EPIDURAL

## 2021-09-14 MED ORDER — SENNOSIDES-DOCUSATE SODIUM 8.6-50 MG PO TABS
2.0000 | ORAL_TABLET | Freq: Every day | ORAL | Status: DC
  Administered 2021-09-15 – 2021-09-16 (×2): 2 via ORAL
  Filled 2021-09-14 (×2): qty 2

## 2021-09-14 MED ORDER — BENZOCAINE-MENTHOL 20-0.5 % EX AERO
1.0000 | INHALATION_SPRAY | CUTANEOUS | Status: DC | PRN
  Administered 2021-09-14: 1 via TOPICAL
  Filled 2021-09-14: qty 56

## 2021-09-14 MED ORDER — FENTANYL CITRATE (PF) 100 MCG/2ML IJ SOLN
50.0000 ug | INTRAMUSCULAR | Status: DC | PRN
  Administered 2021-09-14: 100 ug via INTRAVENOUS
  Filled 2021-09-14: qty 2

## 2021-09-14 MED ORDER — DIPHENHYDRAMINE HCL 25 MG PO CAPS: 25.0000 mg | ORAL_CAPSULE | Freq: Four times a day (QID) | ORAL | Status: DC | PRN

## 2021-09-14 MED ORDER — DIPHENHYDRAMINE HCL 50 MG/ML IJ SOLN
12.5000 mg | INTRAMUSCULAR | Status: DC | PRN
  Administered 2021-09-14: 12.5 mg via INTRAVENOUS
  Administered 2021-09-14: 0.25 mg via INTRAVENOUS
  Filled 2021-09-14 (×2): qty 1

## 2021-09-14 MED ORDER — SIMETHICONE 80 MG PO CHEW: 80.0000 mg | CHEWABLE_TABLET | ORAL | Status: DC | PRN

## 2021-09-14 MED ORDER — EPHEDRINE 5 MG/ML INJ
10.0000 mg | INTRAVENOUS | Status: DC | PRN
Start: 2021-09-14 — End: 2021-09-14

## 2021-09-14 MED ORDER — METHYLERGONOVINE MALEATE 0.2 MG PO TABS: 0.2000 mg | ORAL_TABLET | ORAL | Status: DC | PRN

## 2021-09-14 MED ORDER — FENTANYL-BUPIVACAINE-NACL 0.5-0.125-0.9 MG/250ML-% EP SOLN
12.0000 mL/h | EPIDURAL | Status: DC | PRN
  Administered 2021-09-14: 12 mL/h via EPIDURAL
  Filled 2021-09-14: qty 250

## 2021-09-14 MED ORDER — ONDANSETRON HCL 4 MG/2ML IJ SOLN: 4.0000 mg | INTRAMUSCULAR | Status: DC | PRN

## 2021-09-14 MED ORDER — OXYTOCIN-SODIUM CHLORIDE 30-0.9 UT/500ML-% IV SOLN
1.0000 m[IU]/min | INTRAVENOUS | Status: DC
  Administered 2021-09-14: 2 m[IU]/min via INTRAVENOUS
  Filled 2021-09-14: qty 500

## 2021-09-14 MED ORDER — ONDANSETRON HCL 4 MG/2ML IJ SOLN: 4.0000 mg | Freq: Four times a day (QID) | INTRAMUSCULAR | Status: DC | PRN

## 2021-09-14 MED ORDER — LACTATED RINGERS IV SOLN
500.0000 mL | Freq: Once | INTRAVENOUS | Status: AC
  Administered 2021-09-14: 500 mL via INTRAVENOUS

## 2021-09-14 MED ORDER — SODIUM CHLORIDE 0.9 % IV SOLN
3.0000 g | Freq: Four times a day (QID) | INTRAVENOUS | Status: DC
  Administered 2021-09-14 – 2021-09-15 (×5): 3 g via INTRAVENOUS
  Filled 2021-09-14 (×7): qty 8

## 2021-09-14 MED ORDER — PHENYLEPHRINE 80 MCG/ML (10ML) SYRINGE FOR IV PUSH (FOR BLOOD PRESSURE SUPPORT)
80.0000 ug | PREFILLED_SYRINGE | INTRAVENOUS | Status: DC | PRN
  Filled 2021-09-14: qty 10

## 2021-09-14 MED ORDER — LACTATED RINGERS IV SOLN: 500.0000 mL | Freq: Once | INTRAVENOUS | Status: DC

## 2021-09-14 NOTE — Anesthesia Preprocedure Evaluation (Signed)
Anesthesia Evaluation  Patient identified by MRN, date of birth, ID band Patient awake    Reviewed: Allergy & Precautions, H&P , NPO status , Patient's Chart, lab work & pertinent test results  Airway Mallampati: I   Neck ROM: full    Dental   Pulmonary neg pulmonary ROS,    breath sounds clear to auscultation       Cardiovascular negative cardio ROS   Rhythm:regular Rate:Normal     Neuro/Psych    GI/Hepatic   Endo/Other    Renal/GU      Musculoskeletal   Abdominal   Peds  Hematology   Anesthesia Other Findings   Reproductive/Obstetrics (+) Pregnancy                             Anesthesia Physical Anesthesia Plan  ASA: 1  Anesthesia Plan: Epidural   Post-op Pain Management:    Induction: Intravenous  PONV Risk Score and Plan: 2 and Treatment may vary due to age or medical condition  Airway Management Planned: Natural Airway  Additional Equipment:   Intra-op Plan:   Post-operative Plan:   Informed Consent: I have reviewed the patients History and Physical, chart, labs and discussed the procedure including the risks, benefits and alternatives for the proposed anesthesia with the patient or authorized representative who has indicated his/her understanding and acceptance.     Dental advisory given  Plan Discussed with: Anesthesiologist  Anesthesia Plan Comments:         Anesthesia Quick Evaluation

## 2021-09-14 NOTE — Progress Notes (Signed)
Call from RN RE: variable RN instructed stop Pitocin and to perform intrauterine resuscitation management.  Subjective:    Comfortable with epidural. Pt assisted to right lateral position with peanut ball.   Objective:    VS: BP 109/90   Pulse 87   Temp 97.7 F (36.5 C) (Oral)   Resp 18   Ht 5\' 4"  (1.626 m)   Wt 75.9 kg   SpO2 100%   BMI 28.73 kg/m  FHR : baseline 140 / variability moderate / accelerations present / variable decelerations Toco: contractions every 2-3 minutes  Membranes: SROM x 11 hrs Dilation: 5 Effacement (%): 80 Station: -2 Presentation: Vertex Exam by:: Rhea Pink CNM Pitocin off  Assessment/Plan:   26 y.o. G2P1001 [redacted]w[redacted]d  Labor: Progressing normally Fetal Wellbeing:  Category I Pain Control:  Epidural I/D:   GBS neg Anticipated MOD:  NSVD  Roma Schanz DNP, CNM 09/14/2021 6:01 AM Call from RN RE: variable RN instructed stop Pitocin and to perform intrauterine resuscitation management.

## 2021-09-15 LAB — CBC
HCT: 33 % — ABNORMAL LOW (ref 36.0–46.0)
Hemoglobin: 10.9 g/dL — ABNORMAL LOW (ref 12.0–15.0)
MCH: 31.3 pg (ref 26.0–34.0)
MCHC: 33 g/dL (ref 30.0–36.0)
MCV: 94.8 fL (ref 80.0–100.0)
Platelets: 104 10*3/uL — ABNORMAL LOW (ref 150–400)
RBC: 3.48 MIL/uL — ABNORMAL LOW (ref 3.87–5.11)
RDW: 14 % (ref 11.5–15.5)
WBC: 17.6 10*3/uL — ABNORMAL HIGH (ref 4.0–10.5)
nRBC: 0 % (ref 0.0–0.2)

## 2021-09-15 MED ORDER — OXYCODONE-ACETAMINOPHEN 5-325 MG PO TABS: 1.0000 | ORAL_TABLET | ORAL | Status: DC | PRN

## 2021-09-15 NOTE — Progress Notes (Signed)
MD PROGRESS NOTE  Valerie Ward [id number]  PPD# 1 s/p SVD Subjective:   Patient doing well. She has contraction pain She is tolerating PO fluid and solids w/o nausea or vomiting Bleeding is light Up ad lib / ambulatory / voiding w/o difficulty Feeding: Breast. Mother and baby are bonding well.    Objective:   VS: BP 98/65   Pulse 86   Temp 98.6 F (37 C)   Resp 18   Ht 5\' 4"  (1.626 m)   Wt 75.9 kg   SpO2 100%   Breastfeeding Unknown   BMI 28.73 kg/m   Physical Exam: Alert and oriented X3 Lungs: Clear and unlabored Heart: regular rate and rhythm / no mumurs Abdomen: soft, non-tender, non-distended  Fundus: firm, non-tender, U-1 Perineum: intact Lochia: minimal Extremities: no edema, no calf pain, tenderness, or cords  Labs:  Recent Labs    09/14/21 0234 09/15/21 0452  WBC 14.0* 17.6*  HGB 12.8 10.9*  PLT 121* 104*   Blood type: --/--/O POS (06/27 0234) Rubella: Immune (12/29 0000)                     Assessment:  26 year old G2P2 PPD # 1 s/p NSVD doing well   Plan:  Continue routine post partum care  Anticipate D/C tomorrow    25, MD 09/15/2021, 11:50 AM

## 2021-09-15 NOTE — Lactation Note (Signed)
This note was copied from a baby's chart. Lactation Consultation Note  Patient Name: Valerie Ward HTDSK'A Date: 09/15/2021 Reason for consult: Follow-up assessment;Term Age:24 hours   P2 mother whose infant is now 64 hours old.  This is a term baby at 40+3 weeks.  Mother's current feeding preference is breast.  Baby was frantic and fussy when I arrived; mother reported just finishing a feeding.  Suggested mother put her back to the breast since she is not satisfied.  Explained cluster feeding and that she has started to cluster feed.  Informed mother this may continue throughout the night.  Assisted baby to latch in the football hold.  She latched well and began actively sucking but became frustrated after a few sucks.  Repeated attempts with latching and encouraging her to stay focused and sucking.  Demonstrated breast compressions and, after approximately 10 minutes, she began to settle and sustain a latch; intermittent swallows heard.  Mother identified swallows also.  Observed her feeding for a total of 16 minutes before becoming too sleepy to continue.  Placed her STS and she fell asleep.  Mother will continue to feed on cue and call for latch assistance as needed.  Father and one other visitor present upon arrival and stepped out of the room during the feeding.  Encouraged mother to order dinner and hydrate when father returns.   Maternal Data Has patient been taught Hand Expression?: Yes Does the patient have breastfeeding experience prior to this delivery?: Yes How long did the patient breastfeed?: 1 month  Feeding Mother's Current Feeding Choice: Breast Milk  LATCH Score Latch: Repeated attempts needed to sustain latch, nipple held in mouth throughout feeding, stimulation needed to elicit sucking reflex.  Audible Swallowing: A few with stimulation  Type of Nipple: Everted at rest and after stimulation  Comfort (Breast/Nipple): Soft / non-tender  Hold (Positioning):  Assistance needed to correctly position infant at breast and maintain latch.  LATCH Score: 7   Lactation Tools Discussed/Used    Interventions Interventions: Breast feeding basics reviewed;Assisted with latch;Skin to skin;Breast massage;Hand express;Breast compression;Position options;Expressed milk;Support pillows;Adjust position;Education  Discharge    Consult Status Consult Status: Follow-up Date: 09/16/21 Follow-up type: In-patient    Valerie Ward 09/15/2021, 5:52 PM

## 2021-09-16 ENCOUNTER — Encounter (HOSPITAL_COMMUNITY): Payer: Self-pay | Admitting: Obstetrics and Gynecology

## 2021-09-16 ENCOUNTER — Other Ambulatory Visit (HOSPITAL_COMMUNITY): Payer: Self-pay

## 2021-09-16 MED ORDER — IBUPROFEN 600 MG PO TABS
600.0000 mg | ORAL_TABLET | Freq: Four times a day (QID) | ORAL | 3 refills | Status: DC | PRN
  Filled 2021-09-16: qty 60, 15d supply, fill #0

## 2021-09-16 MED ORDER — SENNOSIDES-DOCUSATE SODIUM 8.6-50 MG PO TABS
2.0000 | ORAL_TABLET | Freq: Two times a day (BID) | ORAL | 3 refills | Status: AC | PRN
  Filled 2021-09-16: qty 30, 8d supply, fill #0

## 2021-09-16 MED ORDER — OXYCODONE-ACETAMINOPHEN 5-325 MG PO TABS
1.0000 | ORAL_TABLET | ORAL | 0 refills | Status: DC | PRN
  Filled 2021-09-16: qty 30, 3d supply, fill #0

## 2021-09-16 NOTE — Discharge Summary (Signed)
[facility] Discharge Summary   Patient Name:   Valerie Ward DOB:     03/03/1996 MRN:     [id number]  Date of Admission:   09/14/2021 Date of Discharge:  09/16/2021  Admitting diagnosis:    Normal labor [O80, Z37.9] Active Problems:   NSVD (normal spontaneous vaginal delivery)     Discharge diagnosis:    Normal labor [O80, Z37.9] Active Problems:   NSVD (normal spontaneous vaginal delivery)  Term Pregnancy Delivered        Additional problems: None                                          Post partum procedures: N/A Augmentation: N/A Complications: Intrauterine Inflammation or infection (Chorioamniotis)  Hospital course: Onset of Labor With Vaginal Delivery      26 y.o. yo [id number] at 50w3dwas admitted in Active Labor on 09/14/2021. Patient had an uncomplicated labor course as follows:  Membrane Rupture Time/Date: 7:00 PM ,09/13/2021   Delivery Method:Vaginal, Spontaneous  Episiotomy: None  Lacerations:  None  Patient had an uncomplicated postpartum course.  She is ambulating, tolerating a regular diet, passing flatus, and urinating well. Patient is discharged home in stable condition on 09/16/21.  Newborn Data: Birth date:09/14/2021  Birth time:10:54 AM  Gender:Female  Living status:Living  Apgars:8 ,9  Weight:3080 g   Magnesium Sulfate received: No BMZ received: No Rhophylac:N/A MMR:No T-DaP:Given prenatally Flu: No Transfusion:No                                                               Type of Delivery:  NSVD Delivering Provider: PSanjuana Kava Date of Delivery:  09/14/21  Newborn Data:  Baby Feeding:   Bottle and Breast Disposition:   home with mother  Physical Exam:   Vitals:   09/15/21 0200 09/15/21 1412 09/15/21 2018 09/16/21 0524  BP: 98/65 103/80 100/78 102/62  Pulse: 86 (!) 57 60 75  Resp:  17  17  Temp: 98.6 F (37 C) 97.9 F (36.6 C) 98.5 F (36.9 C) 98 F (36.7 C)  TempSrc:  Oral  Oral  SpO2:  100% 99% 100%  Weight:       Height:       General: alert, cooperative, and no distress Lochia: appropriate Uterine Fundus: firm Incision: N/A DVT Evaluation: No evidence of DVT seen on physical exam. Negative Homan's sign. No cords or calf tenderness.  Labs: Lab Results  Component Value Date   WBC 17.6 (H) 09/15/2021   HGB 10.9 (L) 09/15/2021   HCT 33.0 (L) 09/15/2021   MCV 94.8 09/15/2021   PLT 104 (L) 09/15/2021      Latest Ref Rng & Units 11/15/2018    7:40 AM  CMP  Glucose 70 - 99 mg/dL 114   BUN 6 - 20 mg/dL 12   Creatinine 0.44 - 1.00 mg/dL 0.71   Sodium 135 - 145 mmol/L 135   Potassium 3.5 - 5.1 mmol/L 4.3   Chloride 98 - 111 mmol/L 105   CO2 22 - 32 mmol/L 19   Calcium 8.9 - 10.3 mg/dL 9.1   Total Protein 6.5 - 8.1 g/dL 7.5   Total  Bilirubin 0.3 - 1.2 mg/dL 1.9   Alkaline Phos 38 - 126 U/L 129   AST 15 - 41 U/L 27   ALT 0 - 44 U/L 14     Discharge instruction: per After Visit Summary and "Baby and Me Booklet".  After Visit Meds:  Allergies as of 09/16/2021       Reactions   Honey Other (See Comments)   Other reaction(s): abdominal pain        Medication List     TAKE these medications    ibuprofen 600 MG tablet Commonly known as: ADVIL Take 1 tablet (600 mg total) by mouth every 6 (six) hours as needed for cramping or moderate pain.   oxyCODONE-acetaminophen 5-325 MG tablet Commonly known as: PERCOCET/ROXICET Take 1-2 tablets by mouth every 4 (four) hours as needed for severe pain.   prenatal multivitamin Tabs tablet Take 1 tablet by mouth daily at 12 noon.   senna-docusate 8.6-50 MG tablet Commonly known as: Senokot-S Take 2 tablets by mouth every 12 (twelve) hours as needed for mild constipation or moderate constipation.        Diet: routine diet  Activity: Advance as tolerated. Pelvic rest for 6 weeks.   Outpatient follow up:6 weeks Follow up Appt:No future appointments. Follow up visit: No follow-ups on file.  Postpartum contraception:  Undecided  09/16/2021 Sanjuana Kava, MD

## 2021-09-16 NOTE — Lactation Note (Signed)
This note was copied from a baby's chart. Lactation Consultation Note  Patient Name: Valerie Ward [id number] Date: 09/16/2021 Reason for consult: Follow-up assessment;Difficult latch;Term;Breastfeeding assistance;Infant weight loss (6.49%) Age:24 hours  P2, Term, Infant Female, 6.49% WL, 49 hours old  LC entered the room and mom was finishing up with feeding baby. Baby was latched to her left breast in the football hold with flanged lips and some swallows were noted with stimulation. Baby fell asleep at the breast after 5 min while LC observed the feed. Mom took baby off the breast and her nipples were round and she states that she does not feel any pain.   Per mom, the latch is painful when baby latches initially, but the pain subsides during the feeding.   Mom states that baby was asleep for 4 hours and she had to wake her to feed. LC praised mom for waking baby and let her know not to allow baby to sleep more than 4 hours without eating.   LC also encouraged mom to hand express milk and spoon feed baby if she has been asleep for 4hrs and then put her to the breast.   LC assessed mom's tissue and did notice some areas that were red on each nipple. Per mom, her nipples are sore on the top and bottom. Mom has been using coconut oil to assist with healing the tissue.   LC spoke with mom about pumping, milk storage, feeding frequency, milk production, engorgement, warning signs, and infant I/O.   Mom is aware of outpatient LC services.   Current Feeding Plan:  Breastfeed according to feeding cues 8+ times in 24 hours.  Wake baby if she has been asleep for 4 hours and feed her.  Hand express and spoon feed baby the expressed milk for stimulation or to entice her to feed.  Watch her output and call the provider with questions or concerns.  Call outpatient LC with questions or for breastfeeding assistance.    LATCH Score Latch: Grasps breast easily, tongue down, lips flanged, rhythmical  sucking.  Audible Swallowing: A few with stimulation (Baby was falling asleep at the end of the feed.)  Type of Nipple: Everted at rest and after stimulation  Comfort (Breast/Nipple): Filling, red/small blisters or bruises, mild/mod discomfort  Hold (Positioning): No assistance needed to correctly position infant at breast.  LATCH Score: 8   Interventions Interventions: Breast feeding basics reviewed;Education  Discharge Discharge Education: Engorgement and breast care;Warning signs for feeding baby;Outpatient recommendation  Consult Status Consult Status: Complete Date: 09/16/21 Follow-up type: Call as needed    Delene Loll 09/16/2021, 12:39 PM

## 2021-09-17 ENCOUNTER — Inpatient Hospital Stay (HOSPITAL_COMMUNITY): Payer: Medicaid Other

## 2021-09-17 ENCOUNTER — Inpatient Hospital Stay (HOSPITAL_COMMUNITY)
Admission: AD | Admit: 2021-09-17 | Payer: Medicaid Other | Source: Home / Self Care | Admitting: Obstetrics & Gynecology

## 2021-09-22 ENCOUNTER — Telehealth (HOSPITAL_COMMUNITY): Payer: Self-pay | Admitting: *Deleted

## 2021-09-22 NOTE — Telephone Encounter (Signed)
Attempted Hospital Discharge Follow-Up Call with help of telephonic interpreter "Tyson Alias [phone]".  No answer.  Did not leave message.

## 2023-01-05 ENCOUNTER — Ambulatory Visit: Payer: Medicaid Other | Admitting: Dermatology

## 2023-01-05 DIAGNOSIS — L649 Androgenic alopecia, unspecified: Secondary | ICD-10-CM | POA: Diagnosis not present

## 2023-01-05 NOTE — Patient Instructions (Addendum)
Hello Valerie Ward ,  Thank you for visiting [name] today. We appreciate your commitment to addressing your hair thinning concerns. Here is a summary of the key instructions from today's consultation:  Diagnosis: Androgenetic Alopecia - Minoxidil Compound Treatment: Begin using a prescription version of minoxidil, compounded with fewer preservatives to reduce scalp irritation, mixed with finasteride for enhanced effectiveness.   - Application: Apply a couple of drops each morning to the affected areas of your scalp. Ensure to wash your hands after application.   - Pharmacy: [facility] will contact you for purchase and delivery details.  - Supplements:   - Viviscal: Start with two tablets daily. Available at [facility] and some [facility] stores.   - Nutrafol: If needed, switch to Nutrafol after 4-6 months, requiring four tablets daily.   - Collagen: Use Vital Proteins collagen powder daily, adding one and a half scoops to your coffee or smoothie.  - Follow-Up: We will schedule a follow-up appointment in four months to assess progress and possibly adjust your treatment.  Please remember that these treatments are most effective when used consistently and are designed to prevent further hair thinning rather than curing the underlying genetic factors.  If you have any questions or need further clarification on your treatment plan, please do not hesitate to contact our office.  Best regards,  Dr. [facility]     Your provider has sent your prescription to [facility] in [place], [place]. A pharmacy representative will call you to confirm details and take your payment information. If you do not receive a call within 24 hours, please contact the pharmacy at [phone] or [phone]. Your unique skincare compound is being formulated in our lab (most compounds take less than 24 hours). Your prescription is shipped vis USPS to your mailbox (2-4 business days). Priority shipping is  available at an additional cost. Once received, you will electronically sign/acknowledge that you received your prescription. The pharmacy hours are Monday-Friday 9 am-6 pm EST and Saturday 9 am-1 pm EST.           Important Information  Due to recent changes in healthcare laws, you may see results of your pathology and/or laboratory studies on MyChart before the doctors have had a chance to review them. We understand that in some cases there may be results that are confusing or concerning to you. Please understand that not all results are received at the same time and often the doctors may need to interpret multiple results in order to provide you with the best plan of care or course of treatment. Therefore, we ask that you please give [name] 2 business days to thoroughly review all your results before contacting the office for clarification. Should we see a critical lab result, you will be contacted sooner.   If You Need Anything After Your Visit  If you have any questions or concerns for your doctor, please call our main line at [phone] If no one answers, please leave a voicemail as directed and we will return your call as soon as possible. Messages left after 4 pm will be answered the following business day.   You may also send [name] a message via MyChart. We typically respond to MyChart messages within 1-2 business days.  For prescription refills, please ask your pharmacy to contact our office. Our fax number is [phone].  If you have an urgent issue when the clinic is closed that cannot wait until the next business day, you can page your doctor at the number  below.    Please note that while we do our best to be available for urgent issues outside of office hours, we are not available 24/7.   If you have an urgent issue and are unable to reach [name], you may choose to seek medical care at your doctor's office, retail clinic, urgent care center, or emergency room.  If you have a medical  emergency, please immediately call 911 or go to the emergency department. In the event of inclement weather, please call our main line at [phone] for an update on the status of any delays or closures.  Dermatology Medication Tips: Please keep the boxes that topical medications come in in order to help keep track of the instructions about where and how to use these. Pharmacies typically print the medication instructions only on the boxes and not directly on the medication tubes.   If your medication is too expensive, please contact our office at [phone] or send [name] a message through MyChart.   We are unable to tell what your co-pay for medications will be in advance as this is different depending on your insurance coverage. However, we may be able to find a substitute medication at lower cost or fill out paperwork to get insurance to cover a needed medication.   If a prior authorization is required to get your medication covered by your insurance company, please allow [name] 1-2 business days to complete this process.  Drug prices often vary depending on where the prescription is filled and some pharmacies may offer cheaper prices.  The website www.goodrx.com contains coupons for medications through different pharmacies. The prices here do not account for what the cost may be with help from insurance (it may be cheaper with your insurance), but the website can give you the price if you did not use any insurance.  - You can print the associated coupon and take it with your prescription to the pharmacy.  - You may also stop by our office during regular business hours and pick up a GoodRx coupon card.  - If you need your prescription sent electronically to a different pharmacy, notify our office through [facility] or by phone at [phone]

## 2023-01-05 NOTE — Progress Notes (Signed)
   New Patient Visit   Subjective  Valerie Ward is a 27 y.o. female who presents for the following: Hair loss - She noticed her hair thinning about 8 months after her baby was born ( her baby is 84 months old). She also noticed some red bumps and itching of her scalp about 3 months ago. She is currently using rosemary drops and olive oil. She tried Minoxidil (from [employer]) for about 4 days but it made her scalp itchy.    The following portions of the chart were reviewed this encounter and updated as appropriate: medications, allergies, medical history  Review of Systems:  No other skin or systemic complaints except as noted in HPI or Assessment and Plan.  Objective  Well appearing patient in no apparent distress; mood and affect are within normal limits.   A focused examination was performed of the following areas: scalp  Relevant exam findings are noted in the Assessment and Plan.    Assessment & Plan    1. Androgenetic Alopecia - Assessment: Noticeable hair thinning around the colic area with no family history of thinning hair. - Plan:   a. Minoxidil and Finasteride: Prescribe a compounded version of minoxidil from [facility] pharmacy with instructions for morning application and hand washing post-application. Inform the patient about the four-month period to observe differences.   b. Viviscal: Recommend Viviscal supplements, two tablets daily, and monitor for four to six months. Consider switching to Nutrafol if results are unsatisfactory.   c. Collagen: Suggest Vital Proteins collagen powder, one and a half scoops daily, to be taken in coffee or a smoothie.   d. Oral Minoxidil and Spironolactone: Discuss as potential further treatment options, noting possible side effects and the importance of hydration.   e. Follow-up: Schedule a follow-up in four months to evaluate treatment progress.  2. Patient Education - Assessment: Need for patient understanding of treatment continuity and  management of expectations. - Plan: Inform the patient that treatments are ongoing and do not cure the underlying cause or alter genetics. Emphasize the necessity of daily or every other day use of Minoxidil to prevent resumption of hair thinning. Address any questions or concerns, providing printed instructions for reference.     Return in about 4 months (around 05/08/2023) for Alopecia.  I, Joanie Coddington, CMA, am acting as scribe for [employer], DO .   Documentation: I have reviewed the above documentation for accuracy and completeness, and I agree with the above.  Langston Reusing, DO

## 2023-01-09 ENCOUNTER — Encounter: Payer: Self-pay | Admitting: Dermatology

## 2023-01-16 ENCOUNTER — Other Ambulatory Visit: Payer: Self-pay

## 2023-01-16 MED ORDER — SAFETY SEAL MISCELLANEOUS MISC: 1.0000 | Freq: Every day | 4 refills | Status: DC

## 2023-01-16 MED ORDER — SAFETY SEAL MISCELLANEOUS MISC: 1.0000 | Freq: Every day | 4 refills | Status: AC

## 2023-01-16 MED ORDER — SAFETY SEAL MISCELLANEOUS MISC
1.0000 | Freq: Every day | 4 refills | Status: DC
Start: 2023-01-16 — End: 2023-01-16

## 2023-01-16 NOTE — Progress Notes (Signed)
Pt states rx wasn't sent so resent it

## 2023-01-16 NOTE — Progress Notes (Signed)
error 

## 2023-01-16 NOTE — Progress Notes (Signed)
Corrected rx.

## 2023-03-16 ENCOUNTER — Ambulatory Visit: Payer: Medicaid Other

## 2023-03-29 ENCOUNTER — Other Ambulatory Visit: Payer: Self-pay

## 2023-03-29 ENCOUNTER — Ambulatory Visit: Payer: Medicaid Other | Attending: Obstetrics and Gynecology

## 2023-03-29 DIAGNOSIS — M62838 Other muscle spasm: Secondary | ICD-10-CM | POA: Insufficient documentation

## 2023-03-29 DIAGNOSIS — R293 Abnormal posture: Secondary | ICD-10-CM | POA: Insufficient documentation

## 2023-03-29 DIAGNOSIS — R102 Pelvic and perineal pain: Secondary | ICD-10-CM | POA: Diagnosis present

## 2023-03-29 DIAGNOSIS — R279 Unspecified lack of coordination: Secondary | ICD-10-CM | POA: Diagnosis present

## 2023-03-29 DIAGNOSIS — M6281 Muscle weakness (generalized): Secondary | ICD-10-CM | POA: Insufficient documentation

## 2023-03-29 NOTE — Therapy (Signed)
 OUTPATIENT PHYSICAL THERAPY FEMALE PELVIC EVALUATION   Patient Name: Valerie Ward MRN: [id number] DOB:[birth year], 28 y.o., female Today's Date: 03/29/2023  END OF SESSION:  PT End of Session - 03/29/23 1237     Visit Number 1    Date for PT Re-Evaluation 09/13/23    Authorization Type [employer]    PT Start Time 1235    PT Stop Time 1310    PT Time Calculation (min) 35 min    Activity Tolerance Patient tolerated treatment well    Behavior During Therapy [facility] for tasks assessed/performed             Past Medical History:  Diagnosis Date   Medical history non-contributory    Normal labor 09/14/2021   Past Surgical History:  Procedure Laterality Date   NO PAST SURGERIES     Patient Active Problem List   Diagnosis Date Noted   NSVD (normal spontaneous vaginal delivery) 04/02/2018    PCP: Armond Cape, MD  REFERRING PROVIDER: Henry Slough, MD   REFERRING DIAG: N39.3 (ICD-10-CM) - Stress incontinence (female) (female)  THERAPY DIAG:  Muscle weakness (generalized)  Other muscle spasm  Unspecified lack of coordination  Abnormal posture  Rationale for Evaluation and Treatment: Rehabilitation  ONSET DATE: 4 years ago  SUBJECTIVE:                                                                                                                                                                                           SUBJECTIVE STATEMENT: Pt is having trouble with urinary leaking.  Fluid intake: Yes: 2 liters of water, cup of coffee in the morning, wine sometimes     PAIN:  Are you having pain? Yes NPRS scale: 2/10, up to 4/10 when at work Pain location:  low back, Lt hip pain  Pain type: needles Pain description: intermittent   Aggravating factors: bending and return to standing, work activities, prolonged standing  Relieving factors: massage  PRECAUTIONS: None  RED FLAGS: None   WEIGHT BEARING RESTRICTIONS: No  FALLS:  Has patient fallen in last 6  months? No  LIVING ENVIRONMENT: Lives with: lives with their family Lives in: House/apartment   OCCUPATION: nurse  PLOF: Independent  PATIENT GOALS: stop leaking  PERTINENT HISTORY:  G2P2  BOWEL MOVEMENT: Pain with bowel movement: No Type of bowel movement:Frequency 1x/day and Strain No Fully empty rectum: Yes: - Leakage: No Pads: Yes: 1 Fiber supplement: No  URINATION: Pain with urination: No Fully empty bladder: Yes: - Stream:  has to push to get urine; does have post void dribbling Urgency: Yes: will leak on the way to the bathroom Frequency: every  2-3 hours; 1x/night Leakage: Urge to void, Walking to the bathroom, Coughing, Sneezing, and Laughing Pads: Yes: tries to use just one a day - pads give her rashes  INTERCOURSE: Pain with intercourse: During Penetration and After Intercourse Ability to have vaginal penetration:  Yes: with pain Climax: not able to have one - changed after first delivery, feels like there is decreased sensation Marinoff Scale: 2/3 Not using lubricant  PREGNANCY: Vaginal deliveries 2 Tearing Yes: sutures with first C-section deliveries 0 Currently pregnant No  PROLAPSE: No pressure   OBJECTIVE:  Note: Objective measures were completed at Evaluation unless otherwise noted. 03/29/23: PATIENT SURVEYS:   PFIQ-7 43  COGNITION: Overall cognitive status: Within functional limits for tasks assessed     SENSATION: Light touch: Appears intact  FUNCTIONAL TESTS:  Squat: Lt weight shift Single leg stance:   Rt: Lt pelvic drop  Lt: Rt pelvic drop  Curl-up test: unable to without UE support  GAIT: Comments: WNL  POSTURE: rounded shoulders, forward head, decreased lumbar lordosis, increased thoracic kyphosis, and anterior pelvic tilt  PELVIC ALIGNMENT: Rt facing sacrum  LUMBARAROM/PROM:  A/PROM A/PROM  Eval (% available)  Flexion 75  Extension 50, pain in back and abdomen  Right lateral flexion 75  Left lateral flexion 75   Right rotation 75  Left rotation 75   (Blank rows = not tested)   PALPATION:   General  low tone in abdominals, no tenderness, decreased lower rib cage mobility                External Perineal Exam perineal scar tissue restriction; irritated nodule on Lt labia minora                             Internal Pelvic Floor tenderness throughout lower Lt level and on perineal scar tissue, tenderness in bil posterior deep layers; reported burning at posterior fourchette  Patient confirms identification and approves PT to assess internal pelvic floor and treatment Yes  PELVIC MMT:   MMT eval  Vaginal 1/5, 4 second hold, 3 repeat contractions with strong abdominal bracing contraction  Diastasis Recti 1 finger width separation, mild distortion, but unable to perform without UE support  (Blank rows = not tested)        TONE: Mild increase  PROLAPSE: Grade 1 anterior vaginal wall laxity  TODAY'S TREATMENT:                                                                                                                              DATE:  03/29/23  EVAL  Neuromuscular re-education: Pt provides verbal consent for internal vaginal/rectal pelvic floor exam. Internal vaginal pelvic floor muscle contraction training Urge drill Quick flicks Long holds Therapeutic activities: Vulvovaginal massage Double void Lubricants    For all possible CPT codes, reference the Planned Interventions line above.     Check all conditions that are expected to impact  treatment: {Conditions expected to impact treatment:None of these apply   If treatment provided at initial evaluation, no treatment charged due to lack of authorization.      PATIENT EDUCATION:  Education details: See above Person educated: Patient Education method: Explanation, Demonstration, Tactile cues, Verbal cues, and Handouts Education comprehension: verbalized understanding  HOME EXERCISE  PROGRAM: [id number]  ASSESSMENT:  CLINICAL IMPRESSION: Patient is a 28 y.o. female who was seen today for physical therapy evaluation and treatment for stress/urge incontinence, dyspareunia, and pelvic/low back pain. Exam findings notable for reduced lumbar A/ROM, abnormal posture, decreased functional core/hip strength demonstrated in single leg stance/squat/curl-up test, pelvic floor muscle weakness, decreased pelvic floor muscle endurance, poor pelvic floor muscle coordination, anterior vaginal wall laxity, perineal scar tissue restriction, tenderness in superficial and deep pelvic floor muscle with burning at posterior fourchette. Signs and symptoms are most consistent with pelvic floor muscle/core weakness and perineal scar tissue restriction. Initial treatment consisted of pelvic floor muscle contraction training, lubricant education, vulvovaginal massage/perineal scar tissue mobilization, double voiding, and urge drill. She will continue to benefit form skilled PT intervention in order to decrease urinary incontinence, improve pelvic/low back pain, and begin/progress functional strengthening program.   OBJECTIVE IMPAIRMENTS: decreased activity tolerance, decreased coordination, decreased endurance, decreased mobility, decreased strength, increased fascial restrictions, increased muscle spasms, impaired tone, postural dysfunction, and pain.   ACTIVITY LIMITATIONS: continence  PARTICIPATION LIMITATIONS: interpersonal relationship, community activity, and occupation  PERSONAL FACTORS: 1 comorbidity: medical history  are also affecting patient's functional outcome.   REHAB POTENTIAL: Good  CLINICAL DECISION MAKING: Stable/uncomplicated  EVALUATION COMPLEXITY: Low   GOALS: Goals reviewed with patient? Yes  SHORT TERM GOALS: Target date: 04/26/23  Pt will be independent with HEP.   Baseline: Goal status: INITIAL  2.  Pt will be independent with the knack, urge suppression technique, and  double voiding in order to improve bladder habits and decrease urinary incontinence.   Baseline:  Goal status: INITIAL  3.  Pt will be independent with vulvovaginal massage, performing at least 5x/week.  Baseline:  Goal status: INITIAL  4.  Pt will increase pelvic floor muscle strength to 3/5. Baseline:  Goal status: INITIAL   LONG TERM GOALS: Target date: 09/13/23  Pt will be independent with advanced HEP.   Baseline:  Goal status: INITIAL  2.  Pt will improve PFIQ-7 score to lower than 20 in order to demonstrate improvement in functional ability.  Baseline:  Goal status: INITIAL  3.  Pt will report low back/pelvic pain no greater than 1/10.  Baseline:  Goal status: INITIAL  4.  Pt will report 0/10 pain with vaginal penetration in order to improve intimate relationship with partner.    Baseline:  Goal status: INITIAL  5.  Pt will not have to use pad due to demonstrate improved bladder control and decrease vulvar irritation.   Baseline:  Goal status: INITIAL  6.  Pt will report no leaks with laughing, coughing, sneezing in order to improve comfort with interpersonal relationships and community activities.   Baseline:  Goal status: INITIAL  PLAN:  PT FREQUENCY: 1-2x/week  PT DURATION: 6 months  PLANNED INTERVENTIONS: 97110-Therapeutic exercises, 97530- Therapeutic activity, 97112- Neuromuscular re-education, 97535- Self Care, [id number]- Manual therapy, Dry Needling, and Biofeedback  PLAN FOR NEXT SESSION: Plan to begin core training; possible use of RUSI; perineal scar tissue mobilization; down training stretches   Josette Mares, PT, DPT01/08/251:19 PM

## 2023-03-29 NOTE — Patient Instructions (Signed)
 Urge Incontinence  Ideal urination frequency is every 2-4 wakeful hours, which equates to 5-8 times within a 24-hour period.   Urge incontinence is leakage that occurs when the bladder muscle contracts, creating a sudden need to go before getting to the bathroom.   Going too often when your bladder isn't actually full can disrupt the body's automatic signals to store and hold urine longer, which will increase urgency/frequency.  In this case, the bladder "is running the show" and strategies can be learned to retrain this pattern.   One should be able to control the first urge to urinate, at around .  The bladder can hold up to a "grande latte," or . To help you gain control, practice the Urge Drill below when urgency strikes.  This drill will help retrain your bladder signals and allow you to store and hold urine longer.  The overall goal is to stretch out your time between voids to reach a more manageable voiding schedule.    Practice your quick flicks often throughout the day (each waking hour) even when you don't need feel the urge to go.  This will help strengthen your pelvic floor muscles, making them more effective in controlling leakage.  Urge Drill  When you feel an urge to go, follow these steps to regain control: Stop what you are doing and be still Take one deep breath, directing your air into your abdomen Think an affirming thought, such as "I've got this." Do 5 quick flicks of your pelvic floor Walk with control to the bathroom to void, or delay voiding     Vulvar/vaginal Massage: This is a technique to help decrease painful sensitivity in the vaginal area. It can also help to restore normal moisture levels in the vaginal tissues. With coconut oil, aloe, jojoba oil, or a specific vaginal moisturizer, gently massage into vaginal tissues. Think of this as part of your post-shower routine and moisturizing just like you would the rest of the body with lotion. This  helps to increase good blood flow to the vaginal tissues. In addition, it also teaches the body that touch to the vagina does not have to be painful or threatening, but moisturizing and gentle.     Double-voiding:  This technique is to help with post-void dribbling, or leaking a little bit when you stand up right after urinating. Use relaxed toileting mechanics to urinate as much as you feel like you have to without straining. Sit back upright from leaning forward and relax this way for 10-20 seconds. Lean forward again to finish voiding any amount more.   [facility] [address] [place], [place] [address] Phone # [phone] Fax [phone]

## 2023-04-12 ENCOUNTER — Ambulatory Visit: Payer: Medicaid Other

## 2023-04-12 DIAGNOSIS — R279 Unspecified lack of coordination: Secondary | ICD-10-CM

## 2023-04-12 DIAGNOSIS — M6281 Muscle weakness (generalized): Secondary | ICD-10-CM | POA: Diagnosis not present

## 2023-04-12 DIAGNOSIS — R293 Abnormal posture: Secondary | ICD-10-CM

## 2023-04-12 DIAGNOSIS — M62838 Other muscle spasm: Secondary | ICD-10-CM

## 2023-04-12 DIAGNOSIS — R102 Pelvic and perineal pain: Secondary | ICD-10-CM

## 2023-04-12 NOTE — Therapy (Signed)
OUTPATIENT PHYSICAL THERAPY FEMALE PELVIC TREATMENT   Patient Name: Valerie Ward MRN: [id number] DOB:[birth year], 28 y.o., female Today's Date: 04/12/2023  END OF SESSION:  PT End of Session - 04/12/23 1235     Visit Number 2    Date for PT Re-Evaluation 09/13/23    Authorization Type [employer]    Authorization Time Period 03/29/2023-05/28/2023    Authorization - Visit Number 1    Authorization - Number of Visits 4    PT Start Time 1235    PT Stop Time 1313    PT Time Calculation (min) 38 min    Activity Tolerance Patient tolerated treatment well    Behavior During Therapy WFL for tasks assessed/performed              Past Medical History:  Diagnosis Date   Medical history non-contributory    Normal labor 09/14/2021   Past Surgical History:  Procedure Laterality Date   NO PAST SURGERIES     Patient Active Problem List   Diagnosis Date Noted   NSVD (normal spontaneous vaginal delivery) 04/02/2018    PCP: Jaymes Graff, MD  REFERRING PROVIDER: Osborn Coho, MD   REFERRING DIAG: N39.3 (ICD-10-CM) - Stress incontinence (female) (female)  THERAPY DIAG:  Muscle weakness (generalized)  Other muscle spasm  Unspecified lack of coordination  Abnormal posture  Pelvic pain  Rationale for Evaluation and Treatment: Rehabilitation  ONSET DATE: 4 years ago  SUBJECTIVE:                                                                                                                                                                                           SUBJECTIVE STATEMENT: Pt reports no changes since her first session. She does feel like urgency drill is helpful when she remembers to do it.  PAIN:  Are you having pain? Yes NPRS scale: 0/10 Pain location:  low back, Lt hip pain  Pain type: needles Pain description: intermittent   Aggravating factors: bending and return to standing, work activities, prolonged standing  Relieving factors: massage  PRECAUTIONS:  None  RED FLAGS: None   WEIGHT BEARING RESTRICTIONS: No  FALLS:  Has patient fallen in last 6 months? No  LIVING ENVIRONMENT: Lives with: lives with their family Lives in: House/apartment   OCCUPATION: nurse  PLOF: Independent  PATIENT GOALS: stop leaking  PERTINENT HISTORY:  G2P2  BOWEL MOVEMENT: Pain with bowel movement: No Type of bowel movement:Frequency 1x/day and Strain No Fully empty rectum: Yes: - Leakage: No Pads: Yes: 1 Fiber supplement: No  URINATION: Pain with urination: No Fully empty bladder: Yes: - Stream:  has to push to get  urine; does have post void dribbling Urgency: Yes: will leak on the way to the bathroom Frequency: every 2-3 hours; 1x/night Leakage: Urge to void, Walking to the bathroom, Coughing, Sneezing, and Laughing Pads: Yes: tries to use just one a day - pads give her rashes  INTERCOURSE: Pain with intercourse: During Penetration and After Intercourse Ability to have vaginal penetration:  Yes: with pain Climax: not able to have one - changed after first delivery, feels like there is decreased sensation Marinoff Scale: 2/3 Not using lubricant  PREGNANCY: Vaginal deliveries 2 Tearing Yes: sutures with first C-section deliveries 0 Currently pregnant No  PROLAPSE: No pressure   OBJECTIVE:  Note: Objective measures were completed at Evaluation unless otherwise noted. 03/29/23: PATIENT SURVEYS:   PFIQ-7 43  COGNITION: Overall cognitive status: Within functional limits for tasks assessed     SENSATION: Light touch: Appears intact  FUNCTIONAL TESTS:  Squat: Lt weight shift Single leg stance:   Rt: Lt pelvic drop  Lt: Rt pelvic drop  Curl-up test: unable to without UE support  GAIT: Comments: WNL  POSTURE: rounded shoulders, forward head, decreased lumbar lordosis, increased thoracic kyphosis, and anterior pelvic tilt  PELVIC ALIGNMENT: Rt facing sacrum  LUMBARAROM/PROM:  A/PROM A/PROM  Eval (% available)   Flexion 75  Extension 50, pain in back and abdomen  Right lateral flexion 75  Left lateral flexion 75  Right rotation 75  Left rotation 75   (Blank rows = not tested)   PALPATION:   General  low tone in abdominals, no tenderness, decreased lower rib cage mobility                External Perineal Exam perineal scar tissue restriction; irritated nodule on Lt labia minora                             Internal Pelvic Floor tenderness throughout lower Lt level and on perineal scar tissue, tenderness in bil posterior deep layers; reported burning at posterior fourchette  Patient confirms identification and approves PT to assess internal pelvic floor and treatment Yes  PELVIC MMT:   MMT eval  Vaginal 1/5, 4 second hold, 3 repeat contractions with strong abdominal bracing contraction  Diastasis Recti 1 finger width separation, mild distortion, but unable to perform without UE support  (Blank rows = not tested)        TONE: Mild increase  PROLAPSE: Grade 1 anterior vaginal wall laxity  TODAY'S TREATMENT:                                                                                                                              DATE:  04/12/23 Neuromuscular re-education: Transversus abdominus training with multimodal cues for improved motor control and breath coordination Bil supine UE ball press with transversus abdominus and pelvic floor muscle contractions and breath coordination 10x Supine hip adduction ball press with transversus abdominus and pelvic floor  muscle contractions and breath coordination 10x Bridge with hip adduction, transversus abdominus, and pelvic floor muscle 2 x 10 Seated hip adduction ball press with transversus abdominus and pelvic floor muscle 2 x 10 Seated hip abduction red band with transversus abdominus and pelvic floor muscle 2 x 10 Seated resisted march red band with transversus abdominus and pelvic floor muscle 2 x 10 Exercises: Lower trunk rotations  2 x 10 Cat cow 2 x 10  03/29/23  EVAL  Neuromuscular re-education: Pt provides verbal consent for internal vaginal/rectal pelvic floor exam. Internal vaginal pelvic floor muscle contraction training Urge drill Quick flicks Long holds Therapeutic activities: Vulvovaginal massage Double void Lubricants    For all possible CPT codes, reference the Planned Interventions line above.     Check all conditions that are expected to impact treatment: {Conditions expected to impact treatment:None of these apply   If treatment provided at initial evaluation, no treatment charged due to lack of authorization.      PATIENT EDUCATION:  Education details: See above Person educated: Patient Education method: Explanation, Demonstration, Tactile cues, Verbal cues, and Handouts Education comprehension: verbalized understanding  HOME EXERCISE PROGRAM: [id number]  ASSESSMENT:  CLINICAL IMPRESSION: Pt has not seen much progress up to this point, but has not been able to work on HEP and lifestyle intervention much up to this point. We discussed the importance of regular pelvic floor muscle contractions at this time since she has been having trouble with motor control and significant weakness. She was instructed that she can perform in seated in order to help work into schedule. We discussed voiding schedule in order to help understand when to use urge drill to make it to the bathroom vs increasing voiding window. She did very well with core activation training and was able to incorporate into other exercises with good coordination. HEP updated and we talked about how to prioritize different exercises. She will continue to benefit form skilled PT intervention in order to decrease urinary incontinence, improve pelvic/low back pain, and begin/progress functional strengthening program.   OBJECTIVE IMPAIRMENTS: decreased activity tolerance, decreased coordination, decreased endurance, decreased mobility,  decreased strength, increased fascial restrictions, increased muscle spasms, impaired tone, postural dysfunction, and pain.   ACTIVITY LIMITATIONS: continence  PARTICIPATION LIMITATIONS: interpersonal relationship, community activity, and occupation  PERSONAL FACTORS: 1 comorbidity: medical history  are also affecting patient's functional outcome.   REHAB POTENTIAL: Good  CLINICAL DECISION MAKING: Stable/uncomplicated  EVALUATION COMPLEXITY: Low   GOALS: Goals reviewed with patient? Yes  SHORT TERM GOALS: Target date: 04/26/23  Pt will be independent with HEP.   Baseline: Goal status: INITIAL  2.  Pt will be independent with the knack, urge suppression technique, and double voiding in order to improve bladder habits and decrease urinary incontinence.   Baseline:  Goal status: INITIAL  3.  Pt will be independent with vulvovaginal massage, performing at least 5x/week.  Baseline:  Goal status: INITIAL  4.  Pt will increase pelvic floor muscle strength to 3/5. Baseline:  Goal status: INITIAL   LONG TERM GOALS: Target date: 09/13/23  Pt will be independent with advanced HEP.   Baseline:  Goal status: INITIAL  2.  Pt will improve PFIQ-7 score to lower than 20 in order to demonstrate improvement in functional ability.  Baseline:  Goal status: INITIAL  3.  Pt will report low back/pelvic pain no greater than 1/10.  Baseline:  Goal status: INITIAL  4.  Pt will report 0/10 pain with vaginal penetration in order to  improve intimate relationship with partner.    Baseline:  Goal status: INITIAL  5.  Pt will not have to use pad due to demonstrate improved bladder control and decrease vulvar irritation.   Baseline:  Goal status: INITIAL  6.  Pt will report no leaks with laughing, coughing, sneezing in order to improve comfort with interpersonal relationships and community activities.   Baseline:  Goal status: INITIAL  PLAN:  PT FREQUENCY: 1-2x/week  PT DURATION:  6 months  PLANNED INTERVENTIONS: 97110-Therapeutic exercises, 97530- Therapeutic activity, 97112- Neuromuscular re-education, 97535- Self Care, [id number]- Manual therapy, Dry Needling, and Biofeedback  PLAN FOR NEXT SESSION: Plan to begin core training; possible use of RUSI; perineal scar tissue mobilization; down training stretches   Julio Alm, PT, DPT01/22/251:14 PM

## 2023-04-19 ENCOUNTER — Ambulatory Visit: Payer: Medicaid Other

## 2023-04-24 ENCOUNTER — Ambulatory Visit: Payer: Medicaid Other | Attending: Obstetrics and Gynecology

## 2023-04-24 DIAGNOSIS — M62838 Other muscle spasm: Secondary | ICD-10-CM | POA: Diagnosis present

## 2023-04-24 DIAGNOSIS — R102 Pelvic and perineal pain unspecified side: Secondary | ICD-10-CM

## 2023-04-24 DIAGNOSIS — R293 Abnormal posture: Secondary | ICD-10-CM | POA: Diagnosis present

## 2023-04-24 DIAGNOSIS — R279 Unspecified lack of coordination: Secondary | ICD-10-CM

## 2023-04-24 DIAGNOSIS — M6281 Muscle weakness (generalized): Secondary | ICD-10-CM

## 2023-04-24 NOTE — Therapy (Signed)
OUTPATIENT PHYSICAL THERAPY FEMALE PELVIC TREATMENT   Patient Name: Valerie Ward MRN: [id number] DOB:[birth year], 28 y.o., female Today's Date: 04/24/2023  END OF SESSION:  PT End of Session - 04/24/23 0936     Visit Number 3    Date for PT Re-Evaluation 09/13/23    Authorization Type [employer]    Authorization Time Period 03/29/2023-05/28/2023    Authorization - Visit Number 2    Authorization - Number of Visits 4    PT Start Time 0936    PT Stop Time 1015    PT Time Calculation (min) 39 min    Activity Tolerance Patient tolerated treatment well    Behavior During Therapy [facility] for tasks assessed/performed              Past Medical History:  Diagnosis Date   Medical history non-contributory    Normal labor 09/14/2021   Past Surgical History:  Procedure Laterality Date   NO PAST SURGERIES     Patient Active Problem List   Diagnosis Date Noted   NSVD (normal spontaneous vaginal delivery) 04/02/2018    PCP: Jaymes Graff, MD  REFERRING PROVIDER: Osborn Coho, MD   REFERRING DIAG: N39.3 (ICD-10-CM) - Stress incontinence (female) (female)  THERAPY DIAG:  Muscle weakness (generalized)  Other muscle spasm  Unspecified lack of coordination  Abnormal posture  Pelvic pain  Rationale for Evaluation and Treatment: Rehabilitation  ONSET DATE: 4 years ago  SUBJECTIVE:                                                                                                                                                                                           SUBJECTIVE STATEMENT: Pt states that she is not noticing any changes in bladder. She was having pain with exercises initially, but now it is gone.   PAIN:  Are you having pain? Yes NPRS scale: 2/10 Pain location:  low back, Lt hip pain  Pain type: needles Pain description: intermittent   Aggravating factors: bending and return to standing, work activities, prolonged standing  Relieving factors:  massage  PRECAUTIONS: None  RED FLAGS: None   WEIGHT BEARING RESTRICTIONS: No  FALLS:  Has patient fallen in last 6 months? No  LIVING ENVIRONMENT: Lives with: lives with their family Lives in: House/apartment   OCCUPATION: nurse  PLOF: Independent  PATIENT GOALS: stop leaking  PERTINENT HISTORY:  G2P2  BOWEL MOVEMENT: Pain with bowel movement: No Type of bowel movement:Frequency 1x/day and Strain No Fully empty rectum: Yes: - Leakage: No Pads: Yes: 1 Fiber supplement: No  URINATION: Pain with urination: No Fully empty bladder: Yes: - Stream:  has to push  to get urine; does have post void dribbling Urgency: Yes: will leak on the way to the bathroom Frequency: every 2-3 hours; 1x/night Leakage: Urge to void, Walking to the bathroom, Coughing, Sneezing, and Laughing Pads: Yes: tries to use just one a day - pads give her rashes  INTERCOURSE: Pain with intercourse: During Penetration and After Intercourse Ability to have vaginal penetration:  Yes: with pain Climax: not able to have one - changed after first delivery, feels like there is decreased sensation Marinoff Scale: 2/3 Not using lubricant  PREGNANCY: Vaginal deliveries 2 Tearing Yes: sutures with first C-section deliveries 0 Currently pregnant No  PROLAPSE: No pressure   OBJECTIVE:  Note: Objective measures were completed at Evaluation unless otherwise noted. 03/29/23: PATIENT SURVEYS:   PFIQ-7 43  COGNITION: Overall cognitive status: Within functional limits for tasks assessed     SENSATION: Light touch: Appears intact  FUNCTIONAL TESTS:  Squat: Lt weight shift Single leg stance:   Rt: Lt pelvic drop  Lt: Rt pelvic drop  Curl-up test: unable to without UE support  GAIT: Comments: WNL  POSTURE: rounded shoulders, forward head, decreased lumbar lordosis, increased thoracic kyphosis, and anterior pelvic tilt  PELVIC ALIGNMENT: Rt facing sacrum  LUMBARAROM/PROM:  A/PROM A/PROM   Eval (% available)  Flexion 75  Extension 50, pain in back and abdomen  Right lateral flexion 75  Left lateral flexion 75  Right rotation 75  Left rotation 75   (Blank rows = not tested)   PALPATION:   General  low tone in abdominals, no tenderness, decreased lower rib cage mobility                External Perineal Exam perineal scar tissue restriction; irritated nodule on Lt labia minora                             Internal Pelvic Floor tenderness throughout lower Lt level and on perineal scar tissue, tenderness in bil posterior deep layers; reported burning at posterior fourchette  Patient confirms identification and approves PT to assess internal pelvic floor and treatment Yes  PELVIC MMT:   MMT eval  Vaginal 1/5, 4 second hold, 3 repeat contractions with strong abdominal bracing contraction  Diastasis Recti 1 finger width separation, mild distortion, but unable to perform without UE support  (Blank rows = not tested)        TONE: Mild increase  PROLAPSE: Grade 1 anterior vaginal wall laxity  TODAY'S TREATMENT:                                                                                                                              DATE:  04/24/23 Manual: Trigger Point Dry Needling  Initial Treatment: Pt instructed on Dry Needling rational, procedures, and possible side effects. Pt instructed to expect mild to moderate muscle soreness later in the day and/or into the next day.  Pt instructed in  methods to reduce muscle soreness. Pt instructed to continue prescribed HEP. Patient was educated on signs and symptoms of infection and other risk factors and advised to seek medical attention should they occur.  Patient verbalized understanding of these instructions and education.   Patient Verbal Consent Given: Yes Education Handout Provided: Yes Muscles Treated: Lt L4-5 lumbar multifidi and glutes Electrical Stimulation Performed: No Treatment Response/Outcome: twitch  response/release Soft tissue mobilization to Lt glutes and lumbar paraspinals Neuromuscular re-education: Diaphragmatic breathing 10 breaths Child's pose 10 breaths Exercises: Cat cow 2 x 10 Seated piriformis stretch 60 seconds bil   04/12/23 Neuromuscular re-education: Transversus abdominus training with multimodal cues for improved motor control and breath coordination Bil supine UE ball press with transversus abdominus and pelvic floor muscle contractions and breath coordination 10x Supine hip adduction ball press with transversus abdominus and pelvic floor muscle contractions and breath coordination 10x Bridge with hip adduction, transversus abdominus, and pelvic floor muscle 2 x 10 Seated hip adduction ball press with transversus abdominus and pelvic floor muscle 2 x 10 Seated hip abduction red band with transversus abdominus and pelvic floor muscle 2 x 10 Seated resisted march red band with transversus abdominus and pelvic floor muscle 2 x 10 Exercises: Lower trunk rotations 2 x 10 Cat cow 2 x 10  03/29/23  EVAL  Neuromuscular re-education: Pt provides verbal consent for internal vaginal/rectal pelvic floor exam. Internal vaginal pelvic floor muscle contraction training Urge drill Quick flicks Long holds Therapeutic activities: Vulvovaginal massage Double void Lubricants    For all possible CPT codes, reference the Planned Interventions line above.     Check all conditions that are expected to impact treatment: {Conditions expected to impact treatment:None of these apply   If treatment provided at initial evaluation, no treatment charged due to lack of authorization.      PATIENT EDUCATION:  Education details: See above Person educated: Patient Education method: Explanation, Demonstration, Tactile cues, Verbal cues, and Handouts Education comprehension: verbalized understanding  HOME EXERCISE PROGRAM: [id number]  ASSESSMENT:  CLINICAL IMPRESSION: Due to some  pain with HEP that has not completely resolved and pain with lifting/bending, dry needling performed this session with good response. We followed this with manual techniques and mobility/stretching activities to further reset muscles. We discussed the rationale behind dry needling these muscles and how important it is to continue with exercises even when she is having some pain. She will continue to benefit form skilled PT intervention in order to decrease urinary incontinence, improve pelvic/low back pain, and begin/progress functional strengthening program.   OBJECTIVE IMPAIRMENTS: decreased activity tolerance, decreased coordination, decreased endurance, decreased mobility, decreased strength, increased fascial restrictions, increased muscle spasms, impaired tone, postural dysfunction, and pain.   ACTIVITY LIMITATIONS: continence  PARTICIPATION LIMITATIONS: interpersonal relationship, community activity, and occupation  PERSONAL FACTORS: 1 comorbidity: medical history  are also affecting patient's functional outcome.   REHAB POTENTIAL: Good  CLINICAL DECISION MAKING: Stable/uncomplicated  EVALUATION COMPLEXITY: Low   GOALS: Goals reviewed with patient? Yes  SHORT TERM GOALS: Target date: 04/26/23  Pt will be independent with HEP.   Baseline: Goal status: INITIAL  2.  Pt will be independent with the knack, urge suppression technique, and double voiding in order to improve bladder habits and decrease urinary incontinence.   Baseline:  Goal status: INITIAL  3.  Pt will be independent with vulvovaginal massage, performing at least 5x/week.  Baseline:  Goal status: INITIAL  4.  Pt will increase pelvic floor muscle strength to 3/5. Baseline:  Goal status: INITIAL   LONG TERM GOALS: Target date: 09/13/23  Pt will be independent with advanced HEP.   Baseline:  Goal status: INITIAL  2.  Pt will improve PFIQ-7 score to lower than 20 in order to demonstrate improvement in  functional ability.  Baseline:  Goal status: INITIAL  3.  Pt will report low back/pelvic pain no greater than 1/10.  Baseline:  Goal status: INITIAL  4.  Pt will report 0/10 pain with vaginal penetration in order to improve intimate relationship with partner.    Baseline:  Goal status: INITIAL  5.  Pt will not have to use pad due to demonstrate improved bladder control and decrease vulvar irritation.   Baseline:  Goal status: INITIAL  6.  Pt will report no leaks with laughing, coughing, sneezing in order to improve comfort with interpersonal relationships and community activities.   Baseline:  Goal status: INITIAL  PLAN:  PT FREQUENCY: 1-2x/week  PT DURATION: 6 months  PLANNED INTERVENTIONS: 97110-Therapeutic exercises, 97530- Therapeutic activity, 97112- Neuromuscular re-education, 97535- Self Care, [id number]- Manual therapy, Dry Needling, and Biofeedback  PLAN FOR NEXT SESSION: Plan to begin core training; possible use of RUSI; perineal scar tissue mobilization; down training stretches   Julio Alm, PT, DPT02/05/2508:15 AM

## 2023-05-08 ENCOUNTER — Ambulatory Visit (INDEPENDENT_AMBULATORY_CARE_PROVIDER_SITE_OTHER): Payer: Medicaid Other | Admitting: Dermatology

## 2023-05-08 ENCOUNTER — Encounter: Payer: Self-pay | Admitting: Dermatology

## 2023-05-08 VITALS — BP 98/65 | HR 88

## 2023-05-08 DIAGNOSIS — L639 Alopecia areata, unspecified: Secondary | ICD-10-CM

## 2023-05-08 DIAGNOSIS — L649 Androgenic alopecia, unspecified: Secondary | ICD-10-CM

## 2023-05-08 DIAGNOSIS — Z79899 Other long term (current) drug therapy: Secondary | ICD-10-CM

## 2023-05-08 MED ORDER — TRIAMCINOLONE ACETONIDE 10 MG/ML IJ SUSP
5.0000 mg | Freq: Once | INTRAMUSCULAR | Status: AC
  Administered 2023-05-08: 5 mg

## 2023-05-08 MED ORDER — CLOBETASOL PROPIONATE 0.05 % EX CREA: 1.0000 | TOPICAL_CREAM | Freq: Two times a day (BID) | CUTANEOUS | 1 refills | Status: AC

## 2023-05-08 NOTE — Patient Instructions (Addendum)
 Hello Valerie Ward,  Thank you for visiting my office today.   Below is a summary of our discussion and the outlined treatment plan:  Topical Minoxidil/ Finasteride Compound: Continue applying as prescribed, focusing on the specified areas of your scalp.  Kenalog 5 Injection: Administered today for the small patch of alopecia areata. This is a one-time treatment given during your visit.  Clobetasol Cream: A prescription has been provided. Please apply daily to the affected area.  Viviscal Supplements: Resume taking one tablet in the morning and one at night, preferably with food to minimize the risk of nausea.  Collagen Supplements: Continue with your current regimen. Expected results in 6-8 months.  Lifestyle Recommendations: Emphasize stress management through meditation, walking, and managing family stress to aid in the treatment of alopecia areata.  Hair Care Products: Avoid keratin treatments containing formaldehyde. Recommended products include Seen shampoo, Biolage Gel-A, and Love Your Curls styling cream.  Sunscreen: Continue using a sunscreen that does not leave a white cast. Recommended [place] sunscreen by Susie Cassette.  Please adhere to the current treatment plan and follow the lifestyle advice provided. We will evaluate your progress at our next appointment, scheduled for four months from now.  You are making good progress, and I am here to support you every step of the way. Keep up the excellent work!  Warm regards,  Dr. Langston Reusing,  Dermatology         Recommended Sunscreen    Important Information   Due to recent changes in healthcare laws, you may see results of your pathology and/or laboratory studies on MyChart before the doctors have had a chance to review them. We understand that in some cases there may be results that are confusing or concerning to you. Please understand that not all results are received at the same time and often the doctors may need to  interpret multiple results in order to provide you with the best plan of care or course of treatment. Therefore, we ask that you please give [name] 2 business days to thoroughly review all your results before contacting the office for clarification. Should we see a critical lab result, you will be contacted sooner.     If You Need Anything After Your Visit   If you have any questions or concerns for your doctor, please call our main line at [phone]. If no one answers, please leave a voicemail as directed and we will return your call as soon as possible. Messages left after 4 pm will be answered the following business day.    You may also send [name] a message via MyChart. We typically respond to MyChart messages within 1-2 business days.  For prescription refills, please ask your pharmacy to contact our office. Our fax number is [phone].  If you have an urgent issue when the clinic is closed that cannot wait until the next business day, you can page your doctor at the number below.     Please note that while we do our best to be available for urgent issues outside of office hours, we are not available 24/7.    If you have an urgent issue and are unable to reach [name], you may choose to seek medical care at your doctor's office, retail clinic, urgent care center, or emergency room.   If you have a medical emergency, please immediately call 911 or go to the emergency department. In the event of inclement weather, please call our main line at [phone] for an update on the status  of any delays or closures.  Dermatology Medication Tips: Please keep the boxes that topical medications come in in order to help keep track of the instructions about where and how to use these. Pharmacies typically print the medication instructions only on the boxes and not directly on the medication tubes.   If your medication is too expensive, please contact our office at [phone] or send [name] a message through  MyChart.    We are unable to tell what your co-pay for medications will be in advance as this is different depending on your insurance coverage. However, we may be able to find a substitute medication at lower cost or fill out paperwork to get insurance to cover a needed medication.    If a prior authorization is required to get your medication covered by your insurance company, please allow [name] 1-2 business days to complete this process.   Drug prices often vary depending on where the prescription is filled and some pharmacies may offer cheaper prices.   The website www.goodrx.com contains coupons for medications through different pharmacies. The prices here do not account for what the cost may be with help from insurance (it may be cheaper with your insurance), but the website can give you the price if you did not use any insurance.  - You can print the associated coupon and take it with your prescription to the pharmacy.  - You may also stop by our office during regular business hours and pick up a GoodRx coupon card.  - If you need your prescription sent electronically to a different pharmacy, notify our office through [facility] or by phone at [phone]

## 2023-05-08 NOTE — Progress Notes (Signed)
   Follow-Up Visit   Subjective  Valerie Ward is a 27 y.o. 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 She was prescribed the compound Minoxidil/finasteride and told to get viviscal and vital proteins over the counter which did help. She stopped using them briefly in November and things started to revert so she restarted all 3 and it has been helping.   The following portions of the chart were reviewed this encounter and updated as appropriate: medications, allergies, medical history  Review of Systems:  No other skin or systemic complaints except as noted in HPI or Assessment and Plan.  Objective  Well appearing patient in no apparent distress; mood and affect are within normal limits.  A focused examination was performed of the following areas: scalp  Relevant exam findings are noted in the Assessment and Plan.             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 Condition is symptomatic/ bothersome to patient. Not currently at goal.   Female Androgenic Alopecia is a chronic condition related to genetics and/or hormonal changes.  In women androgenetic alopecia is commonly associated with menopause but may occur any time after puberty.  It causes hair thinning primarily on the crown with widening of the part and temporal hairline recession.    Treatment Plan: -Continue compound Minoxidil/finastride in the morning -continue Viviscal daily -continue vital proteins collagen every day   Long term medication management.  Patient is using long term (months to years) prescription medication  to control their dermatologic condition.  These medications require periodic  monitoring to evaluate for efficacy and side effects and may require periodic laboratory monitoring.    ALOPECIA AREATA Left Temple Procedure Note Intralesional Injection  Location: left side of head  Informed Consent: Discussed risks (infection, pain, bleeding, bruising, thinning of the skin, loss of skin pigment, lack of resolution, and recurrence of lesion) and benefits of the procedure, as well as the alternatives. Informed consent was obtained. Preparation: The area was prepared a standard fashion.  Anesthesia: N/A  Procedure Details: An intralesional injection was performed with Kenalog 5 mg/cc. 0.05 cc in total were injected. NDC #: 1324-4010-27 Exp: 06/17/24  Total number of injections: 1  Plan: The patient was instructed on post-op care. Recommend OTC analgesia as needed for pain.  Apply clobetasol cream twice a day to spot  Related Medications triamcinolone acetonide (KENALOG) 10 MG/ML injection 5 mg   Return in about 4 months (around 09/05/2023) for alopecia.  Owens Shark, CMA, am acting as scribe for [employer], DO.   Documentation: I have reviewed the above documentation for accuracy and completeness, and I agree with the above.  Langston Reusing, DO

## 2023-06-06 ENCOUNTER — Ambulatory Visit: Payer: Medicaid Other | Attending: Obstetrics and Gynecology

## 2023-06-06 DIAGNOSIS — R102 Pelvic and perineal pain: Secondary | ICD-10-CM | POA: Diagnosis present

## 2023-06-06 DIAGNOSIS — R279 Unspecified lack of coordination: Secondary | ICD-10-CM | POA: Insufficient documentation

## 2023-06-06 DIAGNOSIS — R293 Abnormal posture: Secondary | ICD-10-CM | POA: Insufficient documentation

## 2023-06-06 DIAGNOSIS — M6281 Muscle weakness (generalized): Secondary | ICD-10-CM | POA: Diagnosis present

## 2023-06-06 DIAGNOSIS — M62838 Other muscle spasm: Secondary | ICD-10-CM | POA: Diagnosis present

## 2023-06-06 NOTE — Patient Instructions (Signed)
  Lubrication Used for intercourse to reduce friction Avoid ones that have glycerin, nonoxynol-9, petroleum, propylene glycol, chlorhexidine gluconate, warming gels, tingling gels, icing or cooling gel, scented Avoid parabens due to a preservative similar to female sex hormone May need to be reapplied once or several times during sexual activity Can be applied to both partners genitals prior to vaginal penetration to minimize friction or irritation Prevent irritation and mucosal tears that cause post coital pain and increased the risk of vaginal and urinary tract infections Oil-based lubricants cannot be used with condoms due to breaking them down.  Least likely to irritate vaginal tissue.  Plant based-lubes are safe Silicone-based lubrication are thicker and last long and used for post-menopausal women  Vaginal Lubricators Here is a list of some suggested lubricators you can use for intercourse. Use the most hypoallergenic product.  You can place on you or your partner.  Slippery Stuff ( water based) Sylk or Sliquid Natural H2O ( good  if frequent UTI's)- walmart, amazon Sliquid organics silk-(aloe and silicone based ) [employer] (www.blossom-organics.com)- (aloe based ) Coconut oil, olive oil -not good with condoms  PJur Woman Nude- (water based) amazon Uberlube- ( silicon) Amazon Aloe Vera- Sprouts has an organic one Yes lubricant- (water based and has plant oil based similar to silicone) [employer] Platinum-Silicone, Target, [facility] Olive and Bee intimate cream-  www.oliveandbee.com.au Pink - [employer] Erosense Sync- walmart, amazon Coconu- coconu.com Desert [employer] Good Clean Love lubricants  Things to avoid in lubricants are glycerin, warming gels, tingling gels, icing or cooling  gels, and scented gels.  Also avoid Vaseline. KY jelly,  and Astroglide contain chlorhexidine which kills good bacteria(lactobacilli)  Things to avoid in the vaginal area Do not use  things to irritate the vulvar area No lotions- see below Soaps you  can use :Aveeno, [place], Good Clean Love cleanser if needed. Must be gentle No deodorants No douches Good to sleep without underwear to let the vaginal area to air out No scrubbing: spread the lips to let warm water rinse over labias and pat dry  Creams that can be used on the Vulva Area V [employer], walmart Vital V [place] Salve Julva- [employer] Botanical Pro-Meno [place] Cream Coconut oil, olive oil Cleo by [employer] labial moisturizer -Amazon,  Desert [place] Releveum ( lidocaine) or Desert [employer] Yes Moisturizer

## 2023-06-06 NOTE — Therapy (Signed)
 OUTPATIENT PHYSICAL THERAPY FEMALE PELVIC TREATMENT   Patient Name: Valerie Ward MRN: [id number] DOB:09/09/1995, 28 y.o., female Today's Date: 06/06/2023  END OF SESSION:  PT End of Session - 06/06/23 1107     Visit Number 4    Date for PT Re-Evaluation 09/13/23    Authorization Type [employer]    Authorization Time Period 04/18/23-06/27/23    Authorization - Visit Number 3    Authorization - Number of Visits 4    PT Start Time 1106    PT Stop Time 1145    PT Time Calculation (min) 39 min    Activity Tolerance Patient tolerated treatment well    Behavior During Therapy WFL for tasks assessed/performed               Past Medical History:  Diagnosis Date   Medical history non-contributory    Normal labor 09/14/2021   Past Surgical History:  Procedure Laterality Date   NO PAST SURGERIES     Patient Active Problem List   Diagnosis Date Noted   NSVD (normal spontaneous vaginal delivery) 04/02/2018    PCP: Jaymes Graff, MD  REFERRING PROVIDER: Osborn Coho, MD   REFERRING DIAG: N39.3 (ICD-10-CM) - Stress incontinence (female) (female)  THERAPY DIAG:  Muscle weakness (generalized)  Other muscle spasm  Unspecified lack of coordination  Abnormal posture  Pelvic pain  Rationale for Evaluation and Treatment: Rehabilitation  ONSET DATE: 4 years ago  SUBJECTIVE:                                                                                                                                                                                           SUBJECTIVE STATEMENT: Pt states that she felt much more comfortable after dry needling last session. She is having a little bit more low back pain due to more chores over the last couple of days. She states that she still leaks with a hard sneeze, but she has not had any leaking other than that. She is working on contracting with breathing on a regular basis. She feels 85% better.   PAIN: 06/06/23 Are you having pain?  Yes NPRS scale: 2/10 Pain location:  low back, Lt hip pain  Pain type: needles Pain description: intermittent   Aggravating factors: bending and return to standing, work activities, prolonged standing  Relieving factors: massage  PRECAUTIONS: None  RED FLAGS: None   WEIGHT BEARING RESTRICTIONS: No  FALLS:  Has patient fallen in last 6 months? No  LIVING ENVIRONMENT: Lives with: lives with their family Lives in: House/apartment   OCCUPATION: nurse  PLOF: Independent  PATIENT GOALS: stop leaking  PERTINENT HISTORY:  G2P2  BOWEL MOVEMENT: Pain with bowel movement: No Type of bowel movement:Frequency 1x/day and Strain No Fully empty rectum: Yes: - Leakage: No Pads: Yes: 1 Fiber supplement: No  URINATION: Pain with urination: No Fully empty bladder: Yes: - Stream:  has to push to get urine; does have post void dribbling Urgency: Yes: will leak on the way to the bathroom Frequency: every 2-3 hours; 1x/night Leakage: Urge to void, Walking to the bathroom, Coughing, Sneezing, and Laughing Pads: Yes: tries to use just one a day - pads give her rashes  INTERCOURSE: Pain with intercourse: During Penetration and After Intercourse Ability to have vaginal penetration:  Yes: with pain Climax: not able to have one - changed after first delivery, feels like there is decreased sensation Marinoff Scale: 2/3 Not using lubricant  PREGNANCY: Vaginal deliveries 2 Tearing Yes: sutures with first C-section deliveries 0 Currently pregnant No  PROLAPSE: No pressure   OBJECTIVE:  Note: Objective measures were completed at Evaluation unless otherwise noted. 03/29/23: PATIENT SURVEYS:   PFIQ-7 43  COGNITION: Overall cognitive status: Within functional limits for tasks assessed     SENSATION: Light touch: Appears intact  FUNCTIONAL TESTS:  Squat: Lt weight shift Single leg stance:   Rt: Lt pelvic drop  Lt: Rt pelvic drop  Curl-up test: unable to without UE  support  GAIT: Comments: WNL  POSTURE: rounded shoulders, forward head, decreased lumbar lordosis, increased thoracic kyphosis, and anterior pelvic tilt  PELVIC ALIGNMENT: Rt facing sacrum  LUMBARAROM/PROM:  A/PROM A/PROM  Eval (% available)  Flexion 75  Extension 50, pain in back and abdomen  Right lateral flexion 75  Left lateral flexion 75  Right rotation 75  Left rotation 75   (Blank rows = not tested)   PALPATION:   General  low tone in abdominals, no tenderness, decreased lower rib cage mobility                External Perineal Exam perineal scar tissue restriction; irritated nodule on Lt labia minora                             Internal Pelvic Floor tenderness throughout lower Lt level and on perineal scar tissue, tenderness in bil posterior deep layers; reported burning at posterior fourchette  Patient confirms identification and approves PT to assess internal pelvic floor and treatment Yes  PELVIC MMT:   MMT eval  Vaginal 1/5, 4 second hold, 3 repeat contractions with strong abdominal bracing contraction  Diastasis Recti 1 finger width separation, mild distortion, but unable to perform without UE support  (Blank rows = not tested)        TONE: Mild increase  PROLAPSE: Grade 1 anterior vaginal wall laxity  TODAY'S TREATMENT:  DATE:  06/06/23 Manual: Trigger Point Dry Needling  Initial Treatment: Pt instructed on Dry Needling rational, procedures, and possible side effects. Pt instructed to expect mild to moderate muscle soreness later in the day and/or into the next day.  Pt instructed in methods to reduce muscle soreness. Pt instructed to continue prescribed HEP. Patient was educated on signs and symptoms of infection and other risk factors and advised to seek medical attention should they occur.  Patient verbalized understanding  of these instructions and education.   Patient Verbal Consent Given: Yes Education Handout Provided: Yes Muscles Treated: Lt L4-5 lumbar multifidi and glutes Electrical Stimulation Performed: No Treatment Response/Outcome: twitch response/release Soft tissue mobilization to Lt glutes and lumbar paraspinals Neuromuscular re-education: Pallof press 10x bil red band Shoulder extensions 2 x 10 red band  Therapeutic activities: Squats 2 x 10 to table  Review of vulvovaginal massage Lubricant review   04/24/23 Manual: Trigger Point Dry Needling  Initial Treatment: Pt instructed on Dry Needling rational, procedures, and possible side effects. Pt instructed to expect mild to moderate muscle soreness later in the day and/or into the next day.  Pt instructed in methods to reduce muscle soreness. Pt instructed to continue prescribed HEP. Patient was educated on signs and symptoms of infection and other risk factors and advised to seek medical attention should they occur.  Patient verbalized understanding of these instructions and education.   Patient Verbal Consent Given: Yes Education Handout Provided: Yes Muscles Treated: Lt L4-5 lumbar multifidi and glutes Electrical Stimulation Performed: No Treatment Response/Outcome: twitch response/release Soft tissue mobilization to Lt glutes and lumbar paraspinals Neuromuscular re-education: Diaphragmatic breathing 10 breaths Child's pose 10 breaths Exercises: Cat cow 2 x 10 Seated piriformis stretch 60 seconds bil   04/12/23 Neuromuscular re-education: Transversus abdominus training with multimodal cues for improved motor control and breath coordination Bil supine UE ball press with transversus abdominus and pelvic floor muscle contractions and breath coordination 10x Supine hip adduction ball press with transversus abdominus and pelvic floor muscle contractions and breath coordination 10x Bridge with hip adduction, transversus abdominus,  and pelvic floor muscle 2 x 10 Seated hip adduction ball press with transversus abdominus and pelvic floor muscle 2 x 10 Seated hip abduction red band with transversus abdominus and pelvic floor muscle 2 x 10 Seated resisted march red band with transversus abdominus and pelvic floor muscle 2 x 10 Exercises: Lower trunk rotations 2 x 10 Cat cow 2 x 10      PATIENT EDUCATION:  Education details: See above Person educated: Patient Education method: Explanation, Demonstration, Tactile cues, Verbal cues, and Handouts Education comprehension: verbalized understanding  HOME EXERCISE PROGRAM: [id number]  ASSESSMENT:  CLINICAL IMPRESSION: Pt doing very well overall. She has seen excellent improvements in urinary incontinence, but still will have some small leaking with a hard sneeze. She reports good improvement in low back and hip pain even though she is a little sore today from heavy chores this week. She feels confident with HEP and functional standing progressions we performed today. Believe that continuing to work on HEP and lifestyle modifications, she will continue to make progress towards goal completion on her own. She reports feeling 85% better from her first visit. Due to progress and having met personal goals, she is prepared to D/C skilled PT intervention at this time. She was encouraged to call with any questions/concerns.   OBJECTIVE IMPAIRMENTS: decreased activity tolerance, decreased coordination, decreased endurance, decreased mobility, decreased strength, increased fascial restrictions, increased muscle spasms, impaired tone,  postural dysfunction, and pain.   ACTIVITY LIMITATIONS: continence  PARTICIPATION LIMITATIONS: interpersonal relationship, community activity, and occupation  PERSONAL FACTORS: 1 comorbidity: medical history  are also affecting patient's functional outcome.   REHAB POTENTIAL: Good  CLINICAL DECISION MAKING: Stable/uncomplicated  EVALUATION COMPLEXITY:  Low   GOALS: Goals reviewed with patient? Yes  SHORT TERM GOALS: Target date: 04/26/23 - updated 06/06/23  Pt will be independent with HEP.   Baseline: Goal status: MET 06/06/23  2.  Pt will be independent with the knack, urge suppression technique, and double voiding in order to improve bladder habits and decrease urinary incontinence.   Baseline:  Goal status: MET 06/06/23  3.  Pt will be independent with vulvovaginal massage, performing at least 5x/week.  Baseline:  Goal status: MET 06/06/23  4.  Pt will increase pelvic floor muscle strength to 3/5. Baseline:  Goal status: MET 06/06/23   LONG TERM GOALS: Target date: 09/13/23  Pt will be independent with advanced HEP.   Baseline:  Goal status: MET 06/06/23  2.  Pt will improve PFIQ-7 score to lower than 20 in order to demonstrate improvement in functional ability.  Baseline:  Goal status: MET 06/06/23  3.  Pt will report low back/pelvic pain no greater than 1/10.  Baseline:  Goal status: MET 06/06/23  4.  Pt will report 0/10 pain with vaginal penetration in order to improve intimate relationship with partner.    Baseline:  Goal status: DISCHARGED 06/06/23  5.  Pt will not have to use pad due to demonstrate improved bladder control and decrease vulvar irritation.   Baseline:  Goal status: MET 06/06/23  6.  Pt will report no leaks with laughing, coughing, sneezing in order to improve comfort with interpersonal relationships and community activities.   Baseline:  Goal status: DISCHARGED 06/06/23  PLAN:  PT FREQUENCY: -  PT DURATION: -  PLANNED INTERVENTIONS: -  PLAN FOR NEXT SESSION: DC  PHYSICAL THERAPY DISCHARGE SUMMARY  Visits from Start of Care: 4  Current functional level related to goals / functional outcomes: Independent   Remaining deficits: See above   Education / Equipment: HEP   Patient agrees to discharge. Patient goals were partially met. Patient is being discharged due to being pleased  with the current functional level.  Julio Alm, PT, DPT03/18/2511:38 AM

## 2023-08-16 IMAGING — US US BREAST*L* LIMITED INC AXILLA
1 series · 1 of 1 positions shown · non-contrast
Comparison: None.

CLINICAL DATA: 25-year-old pregnant female with LEFT areolar breast
discomfort. History of prior LEFT areolar "pimple" and infection.

EXAM:
ULTRASOUND OF THE LEFT BREAST

[Series 1: us breast*left* limited inc axilla · 0.07mm/px · 1 of 1 slices shown]
[im 1/1]
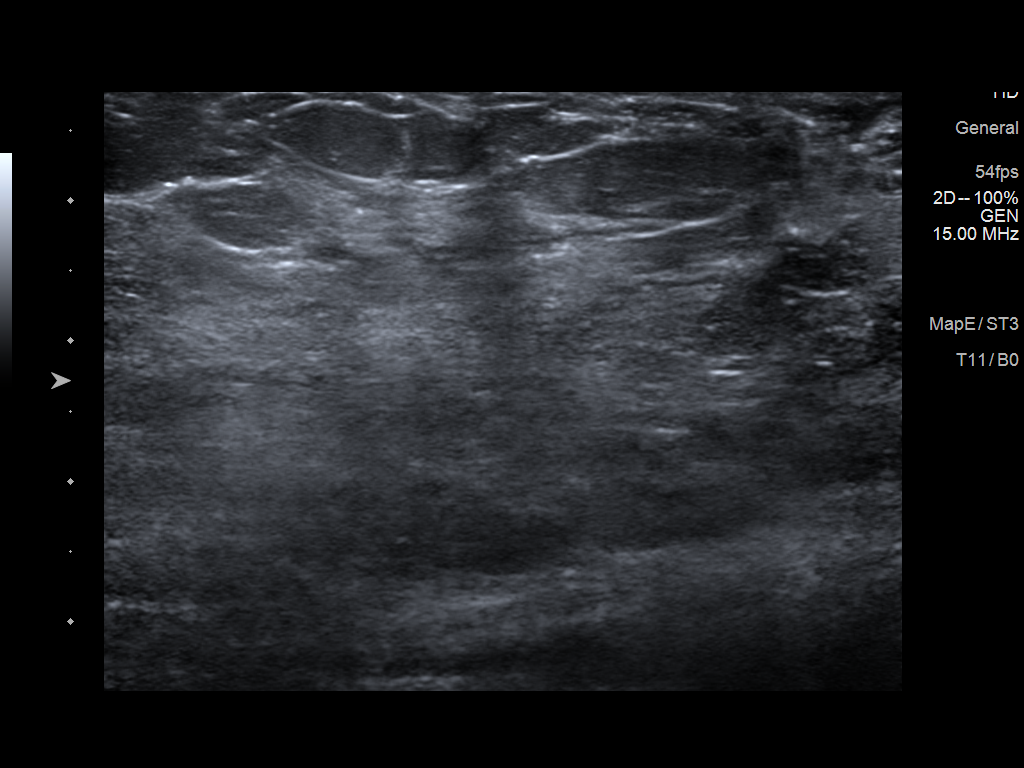

[1 of 1 positions shown; findings below may reference images not displayed]

FINDINGS: On physical exam, the LEFT areola and nipple appear unremarkable. No
palpable RETROAREOLAR abnormalities are noted.

Targeted ultrasound is performed, showing no sonographic
abnormalities in the RETROAREOLAR LEFT breast.
IMPRESSION: 1. No palpable or sonographic abnormalities in the LEFT breast, in
the area of patient concern.

RECOMMENDATION:
Consider clinical follow-up as indicated. Any further workup should
be based on clinical grounds.

Bilateral screening mammogram at age 42.

I have discussed the findings and recommendations with the patient.
If applicable, a reminder letter will be sent to the patient
regarding the next appointment.

BI-RADS CATEGORY  1: Negative.

## 2023-09-14 ENCOUNTER — Ambulatory Visit (INDEPENDENT_AMBULATORY_CARE_PROVIDER_SITE_OTHER): Payer: Medicaid Other | Admitting: Dermatology

## 2023-09-14 VITALS — BP 94/63 | HR 68

## 2023-09-14 DIAGNOSIS — L649 Androgenic alopecia, unspecified: Secondary | ICD-10-CM | POA: Diagnosis not present

## 2023-09-14 DIAGNOSIS — R234 Changes in skin texture: Secondary | ICD-10-CM | POA: Diagnosis not present

## 2023-09-14 MED ORDER — MINOXIDIL 2.5 MG PO TABS: ORAL_TABLET | ORAL | 1 refills | Status: AC

## 2023-09-14 NOTE — Patient Instructions (Addendum)
 Date: Thu Sep 14 2023  Dear Pinkey,  Thank you for visiting today. Here is a summary of the key instructions:  - Medications:   - Take half a 2.5 mg tablet of oral minoxidil once a day with food, preferably at night   - Continue taking Viviscal and collagen supplements  - Skin Care:   - Use an exfoliating body wash with a loofah gently   - Apply La Roche-Posay Lipikar with Urea or Dove Skin Replenish with salicylic acid as a moisturizer on your whole body   - Massage your scalp to keep blood flowing  - Follow-up:   - Return for a follow-up appointment in 4 months  - Precautions:   - Watch for lightheadedness or dizziness while taking oral minoxidil   - If hair grows on your face, stop taking oral minoxidil and it will go away  Please reach out if you have any questions or concerns.  Best Regards,  Dr. [facility]     [facility] Dermatologist [facility]  Wanda Ping, M.D. [facility] [address], [address] [place], [place] [address] Phone: [phone] Fax: [phone] [email or web address]  Important Information   Due to recent changes in healthcare laws, you may see results of your pathology and/or laboratory studies on MyChart before the doctors have had a chance to review them. We understand that in some cases there may be results that are confusing or concerning to you. Please understand that not all results are received at the same time and often the doctors may need to interpret multiple results in order to provide you with the best plan of care or course of treatment. Therefore, we ask that you please give us  2 business days to thoroughly review all your results before contacting the office for clarification. Should we see a critical lab result, you will be contacted sooner.     If You Need Anything After Your Visit   If you  have any questions or concerns for your doctor, please call our main line at [phone]. If no one answers, please leave a voicemail as directed and we will return your call as soon as possible. Messages left after 4 pm will be answered the following business day.    You may also send us  a message via MyChart. We typically respond to MyChart messages within 1-2 business days.  For prescription refills, please ask your pharmacy to contact our office. Our fax number is [phone].  If you have an urgent issue when the clinic is closed that cannot wait until the next business day, you can page your doctor at the number below.     Please note that while we do our best to be available for urgent issues outside of office hours, we are not available 24/7.    If you have an urgent issue and are unable to reach us , you may choose to seek medical care at your doctor's office, retail clinic, urgent care center, or emergency room.   If you have a medical emergency, please immediately call 911 or go to the emergency department. In the event of inclement weather, please call our main line at [phone] for an update on the status of any delays or closures.  Dermatology Medication Tips: Please keep the boxes that topical medications come in in order to help keep track of the instructions about where and how to use these. Pharmacies typically print the  medication instructions only on the boxes and not directly on the medication tubes.   If your medication is too expensive, please contact our office at [phone] or send us  a message through MyChart.    We are unable to tell what your co-pay for medications will be in advance as this is different depending on your insurance coverage. However, we may be able to find a substitute medication at lower cost or fill out paperwork to get insurance to cover a needed medication.    If a prior authorization is required to get your medication covered by your  insurance company, please allow us  1-2 business days to complete this process.   Drug prices often vary depending on where the prescription is filled and some pharmacies may offer cheaper prices.   The website www.goodrx.com contains coupons for medications through different pharmacies. The prices here do not account for what the cost may be with help from insurance (it may be cheaper with your insurance), but the website can give you the price if you did not use any insurance.  - You can print the associated coupon and take it with your prescription to the pharmacy.  - You may also stop by our office during regular business hours and pick up a GoodRx coupon card.  - If you need your prescription sent electronically to a different pharmacy, notify our office through [facility] or by phone at [phone]

## 2023-09-14 NOTE — Progress Notes (Signed)
   Follow-Up Visit   Subjective  Valerie Ward is a 28 y.o. female who presents for the following: alopecia Pt here following up on hair thinning she had experienced after the birth of her child. She was seen on 01/05/23 and prescribed compound of Minoxidil /finasteride and told to get viviscal and vital protein over the counter and spironolactone orally. She is taking the vital protein and viviscal only. She stopped the compound as she thought it wasn't helping and too expensive, the spironolactone she never started. She doesn't feel she has had any improvement   The following portions of the chart were reviewed this encounter and updated as appropriate: medications, allergies, medical history  Review of Systems:  No other skin or systemic complaints except as noted in HPI or Assessment and Plan.  Objective  Well appearing patient in no apparent distress; mood and affect are within normal limits.  A focused examination was performed of the following areas: scalp  Relevant exam findings are noted in the Assessment and Plan.    Assessment & Plan   1. Androgenetic Alopecia - Assessment: Patient has been using topical minoxidil  inconsistently for 4 months with suboptimal application technique, resulting in uneven hair growth. Oral supplements (Viviscal and collagen) have been used, but medical treatment is necessary to prevent further hair loss. Given the inadequate response to topical therapy, transitioning to oral minoxidil  is recommended for more substantial and uniform hair growth.  - Plan:    Discontinue topical minoxidil     Start oral minoxidil  1.25 mg PO daily (half of a 2.5 mg tablet) with food, preferably at night     - Informed consent: Potential side effects include lightheadedness or dizziness (rare), and possible facial hair growth (reversible upon discontinuation)     - Prescription for 60 tablets (41-month supply) to be sent to [facility]    Continue Viviscal and collagen  supplements    Patient education:     - Educate on proper scalp massage technique to improve blood flow    Follow-up in 4 months  2. Rough Skin (Post-epidermatitis) - Assessment: Patient reports itchy skin behind the ear with some residue, consistent with post-epidermatitis. This condition is common in skin folds during hot weather. The affected area was spreading but is now improving.  - Plan:    Use exfoliating body wash (patient already purchased)    Recommend hydrating and exfoliating lotions:     - La Roche-Posay Lipikar with Urea     - Dove Skin Replenish with salicylic acid    Patient education:     - Educate on gentle use of loofah for exfoliation     - Apply moisturizer to entire body   No follow-ups on file.  Valerie Ward, CMA, am acting as scribe for [employer], DO.   Documentation: I have reviewed the above documentation for accuracy and completeness, and I agree with the above.  Valerie Lenis, DO

## 2023-09-16 ENCOUNTER — Encounter: Payer: Self-pay | Admitting: Dermatology

## 2024-01-10 ENCOUNTER — Ambulatory Visit: Admitting: Dermatology
